# Patient Record
Sex: Male | Born: 1985 | Race: Black or African American | Hispanic: No | Smoking: Never smoker
Health system: Southern US, Community
[De-identification: ages and names within clinical notes are randomized; demographics above are authoritative.]

---

## 2006-01-30 ENCOUNTER — Emergency Department (HOSPITAL_COMMUNITY): Admission: EM | Admit: 2006-01-30 | Discharge: 2006-01-30 | Payer: Self-pay | Admitting: Emergency Medicine

## 2007-01-10 ENCOUNTER — Emergency Department (HOSPITAL_COMMUNITY): Admission: EM | Admit: 2007-01-10 | Discharge: 2007-01-10 | Payer: Self-pay | Admitting: Emergency Medicine

## 2007-03-24 ENCOUNTER — Emergency Department (HOSPITAL_COMMUNITY): Admission: EM | Admit: 2007-03-24 | Discharge: 2007-03-24 | Payer: Self-pay | Admitting: Emergency Medicine

## 2008-12-15 ENCOUNTER — Emergency Department (HOSPITAL_COMMUNITY): Admission: EM | Admit: 2008-12-15 | Discharge: 2008-12-15 | Payer: Self-pay | Admitting: Family Medicine

## 2009-02-26 ENCOUNTER — Emergency Department (HOSPITAL_COMMUNITY): Admission: EM | Admit: 2009-02-26 | Discharge: 2009-02-26 | Payer: Self-pay | Admitting: Emergency Medicine

## 2009-05-08 ENCOUNTER — Emergency Department (HOSPITAL_COMMUNITY): Admission: EM | Admit: 2009-05-08 | Discharge: 2009-05-08 | Payer: Self-pay | Admitting: Family Medicine

## 2011-03-23 LAB — GC/CHLAMYDIA PROBE AMP, GENITAL: Chlamydia, DNA Probe: NEGATIVE

## 2011-10-24 ENCOUNTER — Encounter: Payer: Self-pay | Admitting: Emergency Medicine

## 2011-10-24 ENCOUNTER — Emergency Department (HOSPITAL_COMMUNITY)
Admission: EM | Admit: 2011-10-24 | Discharge: 2011-10-24 | Disposition: A | Discharge status: Home / Self Care | Payer: BC Managed Care – PPO | Attending: Emergency Medicine | Admitting: Emergency Medicine

## 2011-10-24 DIAGNOSIS — M79609 Pain in unspecified limb: Secondary | ICD-10-CM | POA: Insufficient documentation

## 2011-10-24 DIAGNOSIS — IMO0002 Reserved for concepts with insufficient information to code with codable children: Secondary | ICD-10-CM

## 2011-10-24 DIAGNOSIS — L03019 Cellulitis of unspecified finger: Secondary | ICD-10-CM | POA: Insufficient documentation

## 2011-10-24 DIAGNOSIS — M7989 Other specified soft tissue disorders: Secondary | ICD-10-CM | POA: Insufficient documentation

## 2011-10-24 MED ORDER — DOXYCYCLINE HYCLATE 100 MG PO CAPS: 100.0000 mg | ORAL_CAPSULE | Freq: Two times a day (BID) | ORAL | Status: DC

## 2011-10-24 MED ORDER — BUPIVACAINE HCL (PF) 0.5 % IJ SOLN
INTRAMUSCULAR | Status: AC
  Administered 2011-10-24: 100 mg
  Filled 2011-10-24: qty 10

## 2011-10-24 MED ORDER — OXYCODONE-ACETAMINOPHEN 5-325 MG PO TABS: 2.0000 | ORAL_TABLET | ORAL | Status: DC | PRN

## 2011-10-24 NOTE — ED Provider Notes (Signed)
History     CSN: [id number] Arrival date & time: 10/24/2011 10:08 AM   First MD Initiated Contact with Patient 10/24/11 1106      Chief Complaint  Patient presents with  . Hand Pain    (Consider location/radiation/quality/duration/timing/severity/associated sxs/prior treatment) HPI Patient presents with pain to right distal fingertip.  No history of trauma.  Swelling and pain started yesterday morning this got worse today.  No previous medical history of sign.  No pertinent past medical history.  Patient denies fever, chills, or other signs of infection. History reviewed. No pertinent past medical history.  History reviewed. No pertinent past surgical history.  No family history on file.  History  Substance Use Topics  . Smoking status: Never Smoker   . Smokeless tobacco: Not on file  . Alcohol Use: Yes      Review of Systems  All other systems reviewed and are negative.    Allergies  Review of patient's allergies indicates no known allergies.  Home Medications  No current outpatient prescriptions on file.  BP 121/76  Pulse 71  Temp(Src) 97.5 F (36.4 C) (Oral)  Resp 18  SpO2 99%  Physical Exam  Nursing note and vitals reviewed. Constitutional: He is oriented to person, place, and time. He appears well-developed and well-nourished. No distress.  HENT:  Head: Normocephalic and atraumatic.  Eyes: Pupils are equal, round, and reactive to light.  Neck: Normal range of motion.  Cardiovascular: Normal rate and intact distal pulses.   Pulmonary/Chest: No respiratory distress.  Abdominal: Normal appearance. He exhibits no distension.  Musculoskeletal:       Hands: Neurological: He is alert and oriented to person, place, and time. No cranial nerve deficit.  Skin: Skin is warm and dry. No rash noted.  Psychiatric: He has a normal mood and affect. His behavior is normal.    ED Course  INCISION AND DRAINAGE Date/Time: 10/24/2011 12:03 PM Performed by: Nelia Shi Authorized by: Nelia Shi Consent: Verbal consent obtained. Risks and benefits: risks, benefits and alternatives were discussed Consent given by: patient Patient understanding: patient states understanding of the procedure being performed Patient consent: the patient's understanding of the procedure matches consent given Procedure consent: procedure consent matches procedure scheduled Relevant documents: relevant documents present and verified Imaging studies: imaging studies available Patient identity confirmed: verbally with patient Type: abscess Body area: upper extremity Location details: right index finger Anesthesia: digital block Local anesthetic: bupivacaine 0.5% without epinephrine Patient sedated: no Scalpel size: 11 Needle gauge: 22 Incision type: single straight Complexity: simple Drainage: bloody Drainage amount: scant Wound treatment: wound left open Patient tolerance: Patient tolerated the procedure well with no immediate complications.   (including critical care time)  Labs Reviewed - No data to display No results found.   1. Paronychia       MDM         Nelia Shi, MD 10/24/11 [phone]

## 2011-10-24 NOTE — ED Notes (Signed)
Pt. Stated, I've had a finger swollen since Friday, No injury.

## 2011-10-24 NOTE — ED Notes (Signed)
Pt reports pain to right hand onset Friday, pt noticed yesterday that right middle finger with swelling. Pt denies injury.

## 2011-10-28 ENCOUNTER — Ambulatory Visit (HOSPITAL_COMMUNITY)
Admission: EM | Admit: 2011-10-28 | Discharge: 2011-10-29 | Disposition: A | Discharge status: Home / Self Care | Payer: BC Managed Care – PPO | Attending: Orthopedic Surgery | Admitting: Orthopedic Surgery

## 2011-10-28 ENCOUNTER — Encounter (HOSPITAL_COMMUNITY): Payer: Self-pay | Admitting: Emergency Medicine

## 2011-10-28 ENCOUNTER — Encounter (HOSPITAL_COMMUNITY): Payer: Self-pay | Admitting: *Deleted

## 2011-10-28 ENCOUNTER — Observation Stay (HOSPITAL_COMMUNITY): Payer: BC Managed Care – PPO | Admitting: *Deleted

## 2011-10-28 ENCOUNTER — Emergency Department (HOSPITAL_COMMUNITY): Payer: BC Managed Care – PPO

## 2011-10-28 ENCOUNTER — Ambulatory Visit: Admit: 2011-10-28 | Payer: Self-pay | Admitting: Orthopedic Surgery

## 2011-10-28 ENCOUNTER — Encounter (HOSPITAL_COMMUNITY)
Admission: EM | Disposition: A | Discharge status: Home / Self Care | Payer: Self-pay | Source: Home / Self Care | Attending: Emergency Medicine

## 2011-10-28 DIAGNOSIS — M6789 Other specified disorders of synovium and tendon, multiple sites: Secondary | ICD-10-CM | POA: Insufficient documentation

## 2011-10-28 DIAGNOSIS — L03019 Cellulitis of unspecified finger: Secondary | ICD-10-CM | POA: Insufficient documentation

## 2011-10-28 DIAGNOSIS — IMO0002 Reserved for concepts with insufficient information to code with codable children: Secondary | ICD-10-CM | POA: Insufficient documentation

## 2011-10-28 DIAGNOSIS — M659 Synovitis and tenosynovitis, unspecified: Secondary | ICD-10-CM

## 2011-10-28 HISTORY — PX: I & D EXTREMITY: SHX5045

## 2011-10-28 LAB — POCT I-STAT, CHEM 8
BUN: 14 mg/dL (ref 6–23)
Creatinine, Ser: 1 mg/dL (ref 0.50–1.35)
Glucose, Bld: 106 mg/dL — ABNORMAL HIGH (ref 70–99)
HCT: 51 % (ref 39.0–52.0)
Hemoglobin: 17.3 g/dL — ABNORMAL HIGH (ref 13.0–17.0)
Potassium: 3.7 mEq/L (ref 3.5–5.1)
TCO2: 25 mmol/L (ref 0–100)

## 2011-10-28 LAB — CBC
Hemoglobin: 17.2 g/dL — ABNORMAL HIGH (ref 13.0–17.0)
MCV: 86.4 fL (ref 78.0–100.0)
RBC: 5.45 MIL/uL (ref 4.22–5.81)
WBC: 7.3 10*3/uL (ref 4.0–10.5)

## 2011-10-28 LAB — DIFFERENTIAL
Basophils Absolute: 0 10*3/uL (ref 0.0–0.1)
Eosinophils Absolute: 0.3 10*3/uL (ref 0.0–0.7)
Monocytes Absolute: 0.4 10*3/uL (ref 0.1–1.0)
Neutro Abs: 4.9 10*3/uL (ref 1.7–7.7)
Neutrophils Relative %: 67 % (ref 43–77)

## 2011-10-28 SURGERY — IRRIGATION AND DEBRIDEMENT EXTREMITY
Anesthesia: General | Site: Finger | Laterality: Right | Wound class: Dirty or Infected

## 2011-10-28 MED ORDER — MORPHINE SULFATE 4 MG/ML IJ SOLN
INTRAMUSCULAR | Status: AC
  Administered 2011-10-28: 2 mg via INTRAVENOUS
  Filled 2011-10-28: qty 1

## 2011-10-28 MED ORDER — MIDAZOLAM HCL 5 MG/5ML IJ SOLN
INTRAMUSCULAR | Status: DC | PRN
  Administered 2011-10-28 (×2): 0.5 mg via INTRAVENOUS
  Administered 2011-10-28: 2 mg via INTRAVENOUS

## 2011-10-28 MED ORDER — LACTATED RINGERS IV SOLN
INTRAVENOUS | Status: DC | PRN
  Administered 2011-10-28 (×2): via INTRAVENOUS

## 2011-10-28 MED ORDER — FENTANYL CITRATE 0.05 MG/ML IJ SOLN
INTRAMUSCULAR | Status: DC | PRN
  Administered 2011-10-28 (×2): 50 ug via INTRAVENOUS

## 2011-10-28 MED ORDER — SODIUM CHLORIDE 0.9 % IR SOLN
Status: DC | PRN
  Administered 2011-10-28: 3000 mL

## 2011-10-28 MED ORDER — CHLORHEXIDINE GLUCONATE 4 % EX LIQD: 60.0000 mL | Freq: Once | CUTANEOUS | Status: DC

## 2011-10-28 MED ORDER — ACETAMINOPHEN 325 MG PO TABS
650.0000 mg | ORAL_TABLET | Freq: Once | ORAL | Status: AC
  Administered 2011-10-28: 650 mg via ORAL
  Filled 2011-10-28: qty 2

## 2011-10-28 MED ORDER — MORPHINE SULFATE 4 MG/ML IJ SOLN
4.0000 mg | Freq: Once | INTRAMUSCULAR | Status: AC
  Administered 2011-10-28: 2 mg via INTRAVENOUS
  Administered 2011-10-28: 4 mg via INTRAVENOUS
  Filled 2011-10-28: qty 1

## 2011-10-28 MED ORDER — OXYCODONE-ACETAMINOPHEN 5-325 MG PO TABS
2.0000 | ORAL_TABLET | ORAL | Status: DC | PRN
  Administered 2011-10-29 (×2): 2 via ORAL
  Filled 2011-10-28 (×2): qty 2

## 2011-10-28 MED ORDER — SENNOSIDES-DOCUSATE SODIUM 8.6-50 MG PO TABS
1.0000 | ORAL_TABLET | Freq: Every evening | ORAL | Status: DC | PRN
  Filled 2011-10-28: qty 1

## 2011-10-28 MED ORDER — MORPHINE SULFATE 2 MG/ML IJ SOLN
1.0000 mg | INTRAMUSCULAR | Status: DC | PRN
  Administered 2011-10-28: 1 mg via INTRAVENOUS
  Filled 2011-10-28: qty 1

## 2011-10-28 MED ORDER — FENTANYL CITRATE 0.05 MG/ML IJ SOLN
25.0000 ug | INTRAMUSCULAR | Status: DC | PRN
  Administered 2011-10-28 (×2): 50 ug via INTRAVENOUS

## 2011-10-28 MED ORDER — METOCLOPRAMIDE HCL 10 MG PO TABS: 5.0000 mg | ORAL_TABLET | Freq: Three times a day (TID) | ORAL | Status: DC | PRN

## 2011-10-28 MED ORDER — KETAMINE HCL 10 MG/ML IJ SOLN
INTRAMUSCULAR | Status: DC | PRN
  Administered 2011-10-28: 25 mg via INTRAVENOUS

## 2011-10-28 MED ORDER — LIDOCAINE HCL (CARDIAC) 20 MG/ML IV SOLN
INTRAVENOUS | Status: DC | PRN
  Administered 2011-10-28: 60 mg via INTRAVENOUS

## 2011-10-28 MED ORDER — METHOCARBAMOL 500 MG PO TABS
500.0000 mg | ORAL_TABLET | Freq: Four times a day (QID) | ORAL | Status: DC | PRN
  Administered 2011-10-29: 500 mg via ORAL
  Filled 2011-10-28: qty 1

## 2011-10-28 MED ORDER — METOCLOPRAMIDE HCL 5 MG/ML IJ SOLN: 5.0000 mg | Freq: Three times a day (TID) | INTRAMUSCULAR | Status: DC | PRN

## 2011-10-28 MED ORDER — METHOCARBAMOL 100 MG/ML IJ SOLN
500.0000 mg | Freq: Four times a day (QID) | INTRAVENOUS | Status: DC | PRN
  Administered 2011-10-28: 500 mg via INTRAVENOUS
  Filled 2011-10-28: qty 5

## 2011-10-28 MED ORDER — SULFAMETHOXAZOLE-TRIMETHOPRIM 800-160 MG PO TABS: 1.0000 | ORAL_TABLET | Freq: Two times a day (BID) | ORAL | Status: AC

## 2011-10-28 MED ORDER — ONDANSETRON HCL 4 MG/2ML IJ SOLN
INTRAMUSCULAR | Status: DC | PRN
  Administered 2011-10-28: 4 mg via INTRAVENOUS

## 2011-10-28 MED ORDER — OXYCODONE-ACETAMINOPHEN 5-325 MG PO TABS: 1.0000 | ORAL_TABLET | Freq: Four times a day (QID) | ORAL | Status: AC | PRN

## 2011-10-28 MED ORDER — ACETAMINOPHEN 10 MG/ML IV SOLN
INTRAVENOUS | Status: DC | PRN
  Administered 2011-10-28: 1000 mg via INTRAVENOUS

## 2011-10-28 MED ORDER — TEMAZEPAM 15 MG PO CAPS: 15.0000 mg | ORAL_CAPSULE | Freq: Every evening | ORAL | Status: DC | PRN

## 2011-10-28 MED ORDER — DIPHENHYDRAMINE HCL 12.5 MG/5ML PO ELIX
12.5000 mg | ORAL_SOLUTION | ORAL | Status: DC | PRN
  Filled 2011-10-28: qty 10

## 2011-10-28 MED ORDER — ONDANSETRON HCL 4 MG/2ML IJ SOLN
4.0000 mg | Freq: Once | INTRAMUSCULAR | Status: AC
  Administered 2011-10-28: 4 mg via INTRAVENOUS
  Filled 2011-10-28: qty 2

## 2011-10-28 MED ORDER — PROPOFOL 10 MG/ML IV EMUL
INTRAVENOUS | Status: DC | PRN
  Administered 2011-10-28: 200 mg via INTRAVENOUS

## 2011-10-28 MED ORDER — MORPHINE SULFATE 2 MG/ML IJ SOLN
1.0000 mg | INTRAMUSCULAR | Status: DC | PRN
  Administered 2011-10-29 (×3): 1 mg via INTRAVENOUS
  Filled 2011-10-28 (×3): qty 1

## 2011-10-28 MED ORDER — CEFAZOLIN SODIUM 1-5 GM-% IV SOLN
1.0000 g | Freq: Once | INTRAVENOUS | Status: AC
  Administered 2011-10-28 (×2): 1 g via INTRAVENOUS
  Filled 2011-10-28: qty 50

## 2011-10-28 MED ORDER — CEFAZOLIN SODIUM 1-5 GM-% IV SOLN
1.0000 g | Freq: Three times a day (TID) | INTRAVENOUS | Status: DC
  Administered 2011-10-29 (×2): 1 g via INTRAVENOUS
  Filled 2011-10-28 (×3): qty 50

## 2011-10-28 SURGICAL SUPPLY — 62 items
BANDAGE COBAN STERILE 2 (GAUZE/BANDAGES/DRESSINGS) IMPLANT
BANDAGE CONFORM 2  STR LF (GAUZE/BANDAGES/DRESSINGS) IMPLANT
BANDAGE ELASTIC 3 VELCRO ST LF (GAUZE/BANDAGES/DRESSINGS) ×3 IMPLANT
BANDAGE ELASTIC 4 VELCRO ST LF (GAUZE/BANDAGES/DRESSINGS) ×2 IMPLANT
BANDAGE GAUZE ELAST BULKY 4 IN (GAUZE/BANDAGES/DRESSINGS) ×2 IMPLANT
BNDG CMPR 9X4 STRL LF SNTH (GAUZE/BANDAGES/DRESSINGS)
BNDG COHESIVE 1X5 TAN STRL LF (GAUZE/BANDAGES/DRESSINGS) IMPLANT
BNDG ESMARK 4X9 LF (GAUZE/BANDAGES/DRESSINGS) IMPLANT
CLOTH BEACON ORANGE TIMEOUT ST (SAFETY) ×2 IMPLANT
CORDS BIPOLAR (ELECTRODE) ×2 IMPLANT
COVER SURGICAL LIGHT HANDLE (MISCELLANEOUS) ×2 IMPLANT
CUFF TOURN SGL QUICK 18 (TOURNIQUET CUFF) ×1 IMPLANT
DECANTER SPIKE VIAL GLASS SM (MISCELLANEOUS) ×2 IMPLANT
DRAIN PENROSE 1/4X12 LTX STRL (WOUND CARE) IMPLANT
DRSG ADAPTIC 3X8 NADH LF (GAUZE/BANDAGES/DRESSINGS) IMPLANT
DRSG EMULSION OIL 3X3 NADH (GAUZE/BANDAGES/DRESSINGS) ×1 IMPLANT
DRSG PAD ABDOMINAL 8X10 ST (GAUZE/BANDAGES/DRESSINGS) ×3 IMPLANT
GAUZE KERLIX 2  STERILE LF (GAUZE/BANDAGES/DRESSINGS) ×1 IMPLANT
GAUZE PACKING IODOFORM 1/4X5 (PACKING) ×1 IMPLANT
GAUZE XEROFORM 1X8 LF (GAUZE/BANDAGES/DRESSINGS) ×1 IMPLANT
GLOVE BIO SURGEON STRL SZ7.5 (GLOVE) ×2 IMPLANT
GLOVE BIOGEL PI IND STRL 8 (GLOVE) ×1 IMPLANT
GLOVE BIOGEL PI INDICATOR 8 (GLOVE) ×1
GLOVE SURG SS PI 8.5 STRL IVOR (GLOVE) ×2
GLOVE SURG SS PI 8.5 STRL STRW (GLOVE) IMPLANT
GOWN PREVENTION PLUS LG XLONG (DISPOSABLE) ×1 IMPLANT
GOWN STRL REIN XL XLG (GOWN DISPOSABLE) ×2 IMPLANT
HANDPIECE INTERPULSE COAX TIP (DISPOSABLE)
IV CYSTO TUBING LATEX FREE (IV SOLUTION) ×2 IMPLANT
KIT BASIN OR (CUSTOM PROCEDURE TRAY) ×2 IMPLANT
KIT ROOM TURNOVER OR (KITS) ×2 IMPLANT
LOOP VESSEL MAXI BLUE (MISCELLANEOUS) IMPLANT
LOOP VESSEL MINI RED (MISCELLANEOUS) IMPLANT
MANIFOLD NEPTUNE II (INSTRUMENTS) ×2 IMPLANT
NDL HYPO 25X1 1.5 SAFETY (NEEDLE) IMPLANT
NEEDLE HYPO 25X1 1.5 SAFETY (NEEDLE) ×2 IMPLANT
NS IRRIG 1000ML POUR BTL (IV SOLUTION) ×2 IMPLANT
PACK ORTHO EXTREMITY (CUSTOM PROCEDURE TRAY) ×2 IMPLANT
PAD ARMBOARD 7.5X6 YLW CONV (MISCELLANEOUS) ×2 IMPLANT
PADDING CAST ABS 3INX4YD NS (CAST SUPPLIES) ×1
PADDING CAST ABS COTTON 3X4 (CAST SUPPLIES) IMPLANT
PADDING WEBRIL 3 STERILE (GAUZE/BANDAGES/DRESSINGS) ×1 IMPLANT
SCRUB BETADINE 4OZ XXX (MISCELLANEOUS) ×1 IMPLANT
SET HNDPC FAN SPRY TIP SCT (DISPOSABLE) IMPLANT
SOLUTION BETADINE 4OZ (MISCELLANEOUS) ×1 IMPLANT
SPONGE GAUZE 4X4 12PLY (GAUZE/BANDAGES/DRESSINGS) ×2 IMPLANT
SPONGE LAP 18X18 X RAY DECT (DISPOSABLE) ×1 IMPLANT
SPONGE LAP 4X18 X RAY DECT (DISPOSABLE) ×1 IMPLANT
SUCTION FRAZIER TIP 10 FR DISP (SUCTIONS) ×1 IMPLANT
SUT ETHILON 4 0 PS 2 18 (SUTURE) ×1 IMPLANT
SUT MON AB 5-0 P3 18 (SUTURE) IMPLANT
SWAB CULTURE LIQ STUART DBL (MISCELLANEOUS) ×2 IMPLANT
SYR 30ML LL (SYRINGE) ×1 IMPLANT
SYR CONTROL 10ML LL (SYRINGE) ×1 IMPLANT
TOWEL OR 17X24 6PK STRL BLUE (TOWEL DISPOSABLE) ×1 IMPLANT
TOWEL OR 17X26 10 PK STRL BLUE (TOWEL DISPOSABLE) ×3 IMPLANT
TUBE ANAEROBIC SPECIMEN COL (MISCELLANEOUS) IMPLANT
TUBE CONNECTING 12X1/4 (SUCTIONS) ×1 IMPLANT
TUBE FEEDING 5FR 15 INCH (TUBING) ×1 IMPLANT
UNDERPAD 30X30 INCONTINENT (UNDERPADS AND DIAPERS) ×2 IMPLANT
WATER STERILE IRR 1000ML POUR (IV SOLUTION) ×1 IMPLANT
YANKAUER SUCT BULB TIP NO VENT (SUCTIONS) ×1 IMPLANT

## 2011-10-28 NOTE — Brief Op Note (Signed)
10/28/2011  10:09 PM  PATIENT:  Fred Romero  25 y.o. male  PRE-OPERATIVE DIAGNOSIS:  infection right long finger  POST-OPERATIVE DIAGNOSIS:  infection right long finger  PROCEDURE:  Procedure(s): IRRIGATION AND DEBRIDEMENT EXTREMITY  SURGEON:  Surgeon(s): Tami Ribas  PHYSICIAN ASSISTANT:   ASSISTANTS: none   ANESTHESIA:   general  EBL:  Total I/O In: 1000 [I.V.:1000] Out: -   BLOOD ADMINISTERED:none  DRAINS: none   LOCAL MEDICATIONS USED:  NONE  SPECIMEN:  Source of Specimen:  right long finger  DISPOSITION OF SPECIMEN:  micro  COUNTS:  YES  TOURNIQUET:  * Missing tourniquet times found for documented tourniquets in log:  9719 *  DICTATION: .Other Dictation: Dictation Number [id number]  PLAN OF CARE: Admit for overnight observation  PATIENT DISPOSITION:  PACU - hemodynamically stable.

## 2011-10-28 NOTE — Transfer of Care (Signed)
Immediate Anesthesia Transfer of Care Note  Patient: Fred Romero  Procedure(s) Performed:  IRRIGATION AND DEBRIDEMENT EXTREMITY  Patient Location: PACU  Anesthesia Type: General  Level of Consciousness: awake, sedated, patient cooperative and responds to stimulation  Airway & Oxygen Therapy: Patient Spontanous Breathing and Patient connected to face mask oxygen  Post-op Assessment: Report given to PACU RN  Post vital signs: Reviewed and stable  Complications: No apparent anesthesia complications

## 2011-10-28 NOTE — ED Provider Notes (Signed)
History     CSN: [id number] Arrival date & time: 10/28/2011  9:26 AM   First MD Initiated Contact with Patient 10/28/11 [phone]      Chief Complaint  Patient presents with  . Finger Injury    (Consider location/radiation/quality/duration/timing/severity/associated sxs/prior treatment) HPI Comments: Patient presents with chief complaint of right middle finger swelling and pain. Patient was seen in emergency department on November 11 and treated for a paronychia, placed on antibiotics, and discharged home with pain medicine. The patient reports that the swelling and pain continued despite treatments.  Patient denies fever, nausea, vomiting.   Patient is a 25 y.o. male presenting with hand pain.  Hand Pain This is a new problem. The current episode started in the past 7 days. The problem occurs constantly. The problem has been rapidly worsening. Associated symptoms include joint swelling and a rash. Pertinent negatives include no abdominal pain, chest pain, chills, fever, headaches, myalgias, nausea, numbness, sore throat, vomiting or weakness. Exacerbated by: extending or palpating finger. He has tried oral narcotics and rest (antibiotics) for the symptoms. The treatment provided no relief.    History reviewed. No pertinent past medical history.  History reviewed. No pertinent past surgical history.  No family history on file.  History  Substance Use Topics  . Smoking status: Never Smoker   . Smokeless tobacco: Not on file  . Alcohol Use: Yes      Review of Systems  Constitutional: Negative for fever and chills.  HENT: Negative for sore throat and rhinorrhea.   Eyes: Negative for redness.  Respiratory: Negative for shortness of breath.   Cardiovascular: Negative for chest pain.  Gastrointestinal: Negative for nausea, vomiting, abdominal pain, diarrhea and constipation.  Genitourinary: Negative for dysuria.  Musculoskeletal: Positive for joint swelling. Negative for myalgias.    Skin: Positive for rash.  Neurological: Negative for dizziness, weakness, numbness and headaches.    Allergies  Review of patient's allergies indicates no known allergies.  Home Medications   Current Outpatient Rx  Name Route Sig Dispense Refill  . DOXYCYCLINE HYCLATE 100 MG PO CAPS Oral Take 1 capsule (100 mg total) by mouth 2 (two) times daily. 20 capsule 0  . OXYCODONE-ACETAMINOPHEN 5-325 MG PO TABS Oral Take 2 tablets by mouth every 4 (four) hours as needed for pain. 15 tablet 0    BP 118/83  Pulse 88  Temp(Src) 97.5 F (36.4 C) (Oral)  Resp 16  SpO2 100%  Physical Exam  Nursing note and vitals reviewed. Constitutional: He is oriented to person, place, and time. He appears well-developed and well-nourished.  HENT:  Head: Normocephalic and atraumatic.  Eyes: Conjunctivae are normal. Right eye exhibits no discharge. Left eye exhibits no discharge.  Neck: Normal range of motion. Neck supple.  Cardiovascular: Normal rate, regular rhythm and normal heart sounds.   Pulmonary/Chest: Effort normal and breath sounds normal.  Abdominal: Soft. Bowel sounds are normal. There is no tenderness. There is no rebound and no guarding.  Musculoskeletal: He exhibits edema and tenderness.       Patient with swelling and tenderness of entire right long finger. The patient holds the finger slightly flexed, is unable to actively extend the finger, and has significant pain when finger is passively extended. The tenderness extends onto the volar aspect of the hand over the flexor tendon.  Neurological: He is alert and oriented to person, place, and time.  Skin: Skin is warm and dry.  Psychiatric: He has a normal mood and affect.    ED  Course  Procedures (including critical care time)  Labs Reviewed  CBC - Abnormal; Notable for the following:    Hemoglobin 17.2 (*)    MCHC 36.5 (*)    All other components within normal limits  DIFFERENTIAL  I-STAT, CHEM 8   No results found.   1.  Flexor tenosynovitis of finger    12:03 PM Patient seen and examined. The patient was seen by and discussed with Dr. Alto Denver. Dr. Merlyn Lot of orthopedic hand surgery was consulted given suspicion for flexor tenosynovitis. He has seen the patient in the CDU and has elected to take the patient to the operating room. The patient has received Ancef, pain medicine in the CDU and remains comfortable. The patient was placed n.p.o. and awaits admission.   MDM  Admission for flexor tenosynovitis.        Eustace Moore [place], [place] 10/28/11 1205

## 2011-10-28 NOTE — H&P (Signed)
Fred Romero is an 25 y.o. male.   Chief Complaint: right long finger infection HPI: 25 yo male with pain and swelling in right long finger.  No known wound or injury.  Does bite fingernails.  Noted swelling began on Sunday.  Seen in ED Sunday where he states index finger was numbed and opened.  Started on Doxycycline.  Continued pain and swelling in long finger.  No fevers, chills, night sweats.  No previous problems with finger, no other issues at this time.  History reviewed. No pertinent past medical history.  History reviewed. No pertinent past surgical history.  No family history on file. Social History:  reports that he has never smoked. He does not have any smokeless tobacco history on file. He reports that he drinks alcohol. He reports that he uses illicit drugs (Marijuana).  Allergies: No Known Allergies  Medications Prior to Admission  Medication Dose Route Frequency Provider Last Rate Last Dose  . ceFAZolin (ANCEF) IVPB 1 g/50 mL premix  1 g Intravenous Once Carolee Rota, PA   1 g at 10/28/11 1119  . morphine 2 MG/ML injection 1 mg  1 mg Intravenous Q1H PRN Nicki Reaper, MD      . morphine 4 MG/ML injection 4 mg  4 mg Intravenous Once Carolee Rota, PA   2 mg at 10/28/11 1231  . ondansetron (ZOFRAN) injection 4 mg  4 mg Intravenous Once Carolee Rota, PA   4 mg at 10/28/11 1116   Medications Prior to Admission  Medication Sig Dispense Refill  . doxycycline (VIBRAMYCIN) 100 MG capsule Take 1 capsule (100 mg total) by mouth 2 (two) times daily.  20 capsule  0  . oxyCODONE-acetaminophen (PERCOCET) 5-325 MG per tablet Take 2 tablets by mouth every 4 (four) hours as needed for pain.  15 tablet  0    Results for orders placed during the hospital encounter of 10/28/11 (from the past 48 hour(s))  CBC     Status: Abnormal   Collection Time   10/28/11 11:19 AM      Component Value Range Comment   WBC 7.3  4.0 - 10.5 (K/uL)    RBC 5.45  4.22 - 5.81 (MIL/uL)    Hemoglobin 17.2 (*) 13.0 - 17.0 (g/dL)    HCT 16.1  09.6 - 04.5 (%)    MCV 86.4  78.0 - 100.0 (fL)    MCH 31.6  26.0 - 34.0 (pg)    MCHC 36.5 (*) 30.0 - 36.0 (g/dL)    RDW 40.9  81.1 - 91.4 (%)    Platelets 227  150 - 400 (K/uL)   DIFFERENTIAL     Status: Normal   Collection Time   10/28/11 11:19 AM      Component Value Range Comment   Neutrophils Relative 67  43 - 77 (%)    Neutro Abs 4.9  1.7 - 7.7 (K/uL)    Lymphocytes Relative 23  12 - 46 (%)    Lymphs Abs 1.7  0.7 - 4.0 (K/uL)    Monocytes Relative 6  3 - 12 (%)    Monocytes Absolute 0.4  0.1 - 1.0 (K/uL)    Eosinophils Relative 4  0 - 5 (%)    Eosinophils Absolute 0.3  0.0 - 0.7 (K/uL)    Basophils Relative 0  0 - 1 (%)    Basophils Absolute 0.0  0.0 - 0.1 (K/uL)   POCT I-STAT, CHEM 8     Status: Abnormal  Collection Time   10/28/11 12:07 PM      Component Value Range Comment   Sodium 138  135 - 145 (mEq/L)    Potassium 3.7  3.5 - 5.1 (mEq/L)    Chloride 103  96 - 112 (mEq/L)    BUN 14  6 - 23 (mg/dL)    Creatinine, Ser 5.40  0.50 - 1.35 (mg/dL)    Glucose, Bld 981 (*) 70 - 99 (mg/dL)    Calcium, Ion 1.91  1.12 - 1.32 (mmol/L)    TCO2 25  0 - 100 (mmol/L)    Hemoglobin 17.3 (*) 13.0 - 17.0 (g/dL)    HCT 47.8  29.5 - 62.1 (%)     No results found.   A comprehensive review of systems was negative.  Blood pressure 142/71, pulse 71, temperature 97.5 F (36.4 C), temperature source Oral, resp. rate 16, SpO2 98.00%.  General appearance: alert, cooperative and appears stated age Head: Normocephalic, without obvious abnormality, atraumatic Neck: supple, symmetrical, trachea midline Resp: clear to auscultation bilaterally Cardio: regular rate and rhythm GI: soft, non-tender; bowel sounds normal; no masses,  no organomegaly Extremities: Left upper extremity with intact sensation and capillary refill in all digits.  +epl/fpl/io.  No wounds.  Right upper extremity intact sensation and capillary refill all digits except  long.  Decreased sensation in long with normal capillary refill.  +epl/fpl/io.  swelling in long finger distal phalanx in pad and dorsally around nail.  pain with palpation of swollen areas.  also with some tenderness over middle and proximal phalanx.  pain with full extension.  no swelling in middle/proximal phalanges.  no proximal streaks. Pulses: 2+ and symmetric Skin: as in extremity exam Neurologic: Grossly normal Incision/Wound: No wound on long finger.  Small wound dorsoradial index by nail.  Assessment/Plan Right long finger felon/paronychia/possible early flexor tenosynovitis.  Recommend irrigation and debridement in OR.  Risks, benefits, and alternatives discussed with patient and he agrees with plan of care.  OR scheduled for this afternoon.  Lido Maske R 10/28/2011, 1:23 PM

## 2011-10-28 NOTE — ED Notes (Signed)
Presents with swollen rt middle finger. States sx began on Sunday and was seen here in dept and rx'd abx. Pt returns stating no improvement. Affected finger unable to extend w/o severe pain. Pt now stating pain radiating into hand. States unable grasp objects w/o pain. Denies fevers. Awaiting hand surgery consult. Pt in no acute distress, a&ox3.

## 2011-10-28 NOTE — ED Notes (Signed)
Onset 4 days ago right middle finger swelling unknown cause was given antibiotics and work note however pain continued.

## 2011-10-28 NOTE — Op Note (Signed)
Dictation [id number]

## 2011-10-28 NOTE — ED Provider Notes (Signed)
Medical screening examination/treatment/procedure(s) were conducted as a shared visit with non-physician practitioner(s) and myself.  I personally evaluated the patient during the encounter  MSK: right hand with 3rd digit with fusiform swelling, TTP over the flexor surface of the affected digit, and pain on passive extension  Cyndra Numbers, MD 10/28/11 2113

## 2011-10-28 NOTE — Anesthesia Preprocedure Evaluation (Signed)
Anesthesia Evaluation  Patient identified by MRN, date of birth, ID band Patient awake    Reviewed: Allergy & Precautions, H&P , NPO status , Patient's Chart, lab work & pertinent test results  Airway Mallampati: II TM Distance: >3 FB Neck ROM: Full    Dental No notable dental hx.    Pulmonary neg pulmonary ROS,  clear to auscultation  Pulmonary exam normal       Cardiovascular neg cardio ROS Regular Normal    Neuro/Psych Negative Neurological ROS  Negative Psych ROS   GI/Hepatic negative GI ROS, Neg liver ROS,   Endo/Other  Negative Endocrine ROS  Renal/GU negative Renal ROS  Genitourinary negative   Musculoskeletal negative musculoskeletal ROS (+)   Abdominal   Peds negative pediatric ROS (+)  Hematology negative hematology ROS (+)   Anesthesia Other Findings   Reproductive/Obstetrics negative OB ROS                           Anesthesia Physical Anesthesia Plan  ASA: II  Anesthesia Plan: General   Post-op Pain Management:    Induction: Intravenous  Airway Management Planned: LMA  Additional Equipment:   Intra-op Plan:   Post-operative Plan:   Informed Consent: I have reviewed the patients History and Physical, chart, labs and discussed the procedure including the risks, benefits and alternatives for the proposed anesthesia with the patient or authorized representative who has indicated his/her understanding and acceptance.   Dental advisory given  Plan Discussed with: CRNA  Anesthesia Plan Comments:         Anesthesia Quick Evaluation

## 2011-10-28 NOTE — ED Notes (Signed)
Pt received room assignment of 5003.  Report called to Nicholaus Bloom, RN.  All questions answered. Pt to floor via wheelchair.  NAD noted; no further c/o's voiced.

## 2011-10-28 NOTE — Anesthesia Postprocedure Evaluation (Signed)
  Anesthesia Post-op Note  Patient: Fred Romero  Procedure(s) Performed:  IRRIGATION AND DEBRIDEMENT EXTREMITY - IRRIGATION AND DEBRIDMENT RIGHT LONG FINGER  Patient Location: PACU  Anesthesia Type: General  Level of Consciousness: awake and alert   Airway and Oxygen Therapy: Patient Spontanous Breathing  Post-op Pain: mild  Post-op Assessment: Post-op Vital signs reviewed, Patient's Cardiovascular Status Stable, Respiratory Function Stable, Patent Airway and No signs of Nausea or vomiting  Post-op Vital Signs: stable  Complications: No apparent anesthesia complications

## 2011-10-28 NOTE — Preoperative (Signed)
Beta Blockers   Reason not to administer Beta Blockers:Not Applicable, pt not on a bb at home

## 2011-10-28 NOTE — ED Notes (Signed)
Pt transferred to yellow 32 from CDU7 for holding.  Pt to have surgery performed on 3rd digit of right hand.  Site upon transfer with significant amount of swelling.  Pt rates pain at 7/10 and constant.  He describes pain as burning, stabbing. States "feels like a tooth ache in his finger".  Pain worsens with movement.  VSS.  Monitoring continues.

## 2011-10-29 ENCOUNTER — Encounter (HOSPITAL_COMMUNITY): Payer: Self-pay | Admitting: Orthopedic Surgery

## 2011-10-29 NOTE — Op Note (Signed)
Fred Romero, Fred Romero NO.:  [id number]  MEDICAL RECORD NO.:  [id number]  LOCATION:  1614                         FACILITY:  [facility]  PHYSICIAN:  Betha Loa, MD        DATE OF BIRTH:  [birth year]  DATE OF PROCEDURE:  10/28/2011 DATE OF DISCHARGE:                              OPERATIVE REPORT   PREOPERATIVE DIAGNOSIS:  Right long finger paronychia, felon, and flexor sheath infection.  POSTOPERATIVE DIAGNOSIS:  Right long finger paronychia, felon, and flexor sheath infection.  PROCEDURE:  Irrigation and debridement of right long finger paronychia, felon, and flexor sheath.  SURGEON:  Betha Loa, MD.  ASSISTANT:  None.  ANESTHESIA:  General.  IV FLUIDS:  Per anesthesia flow sheet.  ESTIMATED BLOOD LOSS:  Minimal.  COMPLICATIONS:  None.  SPECIMENS:  Cultures to micro.  TOURNIQUET TIME:  35 minutes.  DISPOSITION:  Stable to PACU.  INDICATIONS:  Fred Romero is a 25 year old male who on Sunday began to notice pain in his right long finger.  He presented to  [facility] emergency department where an irrigation and debridement was performed. He states this was done on the index finger, however.  He was placed on doxycycline.  He continued to have swelling and pain in the right long finger.  He returned to the emergency department today and I was consulted for management of injury.  On examination, he had a swollen and tender distal phalanx of the right long finger.  There is swelling dorsally at the base of the nail as well as in the pad of the finger. He is also tender over the proximal and middle phalanges volarly.  He had pain with extension of the finger.  I discussed Mr. Lovan the nature of infections of the finger.  I recommended going to the operating room for irrigation and debridement of the paronychia, felon, and flexor tendon sheath.  Risks, benefits, and alternatives of surgery were discussed including risk of blood loss, infection, damage to  nerves, vessels, tendons, ligaments, bone, fair surgery, need for additional surgery, complications with wound healing, continued pain, continued infection, need for repeat irrigation and debridement.  He agreed with the plan of care.  He will be given IV Ancef in the emergency department.  OPERATIVE COURSE:  After being identified preoperatively by myself, the Patient & I agreed upon procedure & site of procedure.  The surgical site was marked.  The risks, benefits, and alternative of surgery were reviewed and he wished to proceed.  Surgical consent was signed.  He was transported to the operating room, placed on the operating table in supine position with the right upper extremity on arm board.  General anesthesia was induced by the anesthesiologist.  Right upper extremity was prepped and draped in normal sterile orthopedic fashion.  Surgical pause was performed between surgeons, anesthesia, and operating room staff, and all were in agreement as to the patient, procedure, and site of procedure.  Tourniquet at the proximal aspect of the extremity was inflated to 250 mmHg after exsanguination of the forearm with an Esmarch bandage.  Gravity exsanguination was used to the hand.  An incision was made volarly over the pad of the long  finger.  There was gross purulence. This was cultured for both aerobes and anaerobes.  Incision was made at the dorsal aspect of the finger paronychia.  There was gross purulence in this area as well.  Some of the skin had been devitalized and this was debrided.  The infected area was debrided.  It was ensured that the entire pad of the finger had been released.  The wounds were copiously irrigated with sterile saline.  A incision was made in the palm of the hand over the A1 pulley.  This carried into subcutaneous tissues.  Care was taken to protect the neurovascular bundles.  A #5 pediatric feeding tube was placed at the flexor tendon sheath.  There is no  gross purulence within the sheath.  100 mL of sterile saline was irrigated through the flexor tendon sheath by #5 pediatric feeding tube. There is good effluent from both the proximal and distal wounds.  All wounds were then again copiously irrigated with 1000 mL of sterile saline.  The pediatric feeding tube was removed.  All wounds were packed with quarter-inch iodoform gauze.  They were then dressed with sterile 4x4s, Kerlix, and a volar splint placed, wrapped with Kerlix and Ace bandage.  Tourniquet was deflated at 35 minutes.  Fingertips were pink with brisk capillary refill after deflation of tourniquet.  Operative drapes were broken down.  The patient was awoken from anesthesia safely. He was transferred back to stretcher, taken to PACU in stable condition. We will keep him overnight for IV antibiotics and discharge him home in the morning.     Betha Loa, MD     KK/MEDQ  D:  10/28/2011  T:  10/29/2011  Job:  [id number]

## 2011-11-02 LAB — ANAEROBIC CULTURE

## 2012-05-09 ENCOUNTER — Encounter (HOSPITAL_COMMUNITY): Payer: Self-pay | Admitting: Emergency Medicine

## 2012-05-09 ENCOUNTER — Emergency Department (HOSPITAL_COMMUNITY): Payer: BC Managed Care – PPO

## 2012-05-09 ENCOUNTER — Emergency Department (HOSPITAL_COMMUNITY)
Admission: EM | Admit: 2012-05-09 | Discharge: 2012-05-10 | Disposition: A | Discharge status: Home / Self Care | Payer: BC Managed Care – PPO | Attending: Emergency Medicine | Admitting: Emergency Medicine

## 2012-05-09 DIAGNOSIS — R071 Chest pain on breathing: Secondary | ICD-10-CM | POA: Insufficient documentation

## 2012-05-09 DIAGNOSIS — R0789 Other chest pain: Secondary | ICD-10-CM

## 2012-05-09 DIAGNOSIS — I440 Atrioventricular block, first degree: Secondary | ICD-10-CM | POA: Insufficient documentation

## 2012-05-09 LAB — DIFFERENTIAL
Basophils Relative: 1 % (ref 0–1)
Eosinophils Absolute: 0.4 10*3/uL (ref 0.0–0.7)
Eosinophils Relative: 4 % (ref 0–5)
Monocytes Relative: 8 % (ref 3–12)
Neutrophils Relative %: 60 % (ref 43–77)

## 2012-05-09 LAB — CBC
Hemoglobin: 16.7 g/dL (ref 13.0–17.0)
MCH: 31.5 pg (ref 26.0–34.0)
MCHC: 35.5 g/dL (ref 30.0–36.0)
Platelets: 209 10*3/uL (ref 150–400)

## 2012-05-09 MED ORDER — KETOROLAC TROMETHAMINE 30 MG/ML IJ SOLN
30.0000 mg | Freq: Once | INTRAMUSCULAR | Status: AC
  Administered 2012-05-09: 30 mg via INTRAMUSCULAR
  Filled 2012-05-09: qty 1

## 2012-05-09 NOTE — ED Notes (Signed)
Chest pain after playing basketball three days ago.  Denies any shortness of breath.  Paitent states he has not had this before.

## 2012-05-09 NOTE — ED Provider Notes (Signed)
History     CSN: [id number]  Arrival date & time 05/09/12  2053   None     Chief Complaint  Patient presents with  . Chest Pain    (Consider location/radiation/quality/duration/timing/severity/associated sxs/prior treatment) HPI Comments: After playing basketball 3 days ago.  Patient had some mild chest discomfort.  That was are relatively nonspecific.  Today, he developed a sharp, stabbing pain in the substernal area, with nausea, that he's never had before.  This is been constant since then.  He does not endorse history of asthma.  Cardiac history hypertension,  Smoking.  He does use marijuana on a regular basis.  He states on Friday and Saturday had several episodes of vomiting without diarrhea  Patient is a 26 y.o. male presenting with chest pain. The history is provided by the patient.  Chest Pain The chest pain began 3 - 5 days ago. Chest pain occurs constantly. The chest pain is worsening. The pain is associated with breathing, coughing, exertion and lifting. At its most intense, the pain is at 6/10. Pertinent negatives for primary symptoms include no fever, no shortness of breath, no cough, no palpitations, no nausea and no vomiting.     History reviewed. No pertinent past medical history.  Past Surgical History  Procedure Date  . I&d extremity 10/28/2011    Procedure: IRRIGATION AND DEBRIDEMENT EXTREMITY;  Surgeon: Tami Ribas;  Location: [facility];  Service: Orthopedics;  Laterality: Right;  IRRIGATION AND DEBRIDMENT RIGHT LONG FINGER    History reviewed. No pertinent family history.  History  Substance Use Topics  . Smoking status: Never Smoker   . Smokeless tobacco: Never Used  . Alcohol Use: Yes     occassionaly      Review of Systems  Constitutional: Negative for fever.  Respiratory: Negative for cough, chest tightness and shortness of breath.   Cardiovascular: Positive for chest pain. Negative for palpitations.  Gastrointestinal: Negative for nausea and  vomiting.    Allergies  Review of patient's allergies indicates no known allergies.  Home Medications   Current Outpatient Rx  Name Route Sig Dispense Refill  . CYCLOBENZAPRINE HCL 10 MG PO TABS Oral Take 0.5 tablets (5 mg total) by mouth 3 (three) times daily as needed. 20 tablet 0  . IBUPROFEN 600 MG PO TABS Oral Take 1 tablet (600 mg total) by mouth every 6 (six) hours as needed for pain. 30 tablet 0    BP 137/72  Pulse 74  Temp(Src) 99.2 F (37.3 C) (Oral)  Resp 20  SpO2 98%  Physical Exam  Constitutional: He appears well-developed. He appears distressed.  HENT:  Head: Normocephalic.  Eyes: Pupils are equal, round, and reactive to light.  Neck: Normal range of motion.  Cardiovascular: Normal rate and regular rhythm.   Pulmonary/Chest: Effort normal and breath sounds normal. He exhibits no tenderness.  Abdominal: Soft. He exhibits no distension. There is no tenderness.  Musculoskeletal: Normal range of motion.  Neurological: He is alert.  Skin: Skin is warm and dry. No rash noted.    ED Course  Procedures (including critical care time)   Labs Reviewed  CBC  DIFFERENTIAL  COMPREHENSIVE METABOLIC PANEL  LIPASE, BLOOD   Dg Chest 2 View  05/10/2012  *RADIOLOGY REPORT*  Clinical Data: Mid chest pain.  CHEST - 2 VIEW  Comparison: 01/10/2007.  Findings:  The heart size and mediastinal contours are within normal limits.  Both lungs are clear.  The visualized skeletal structures are unremarkable. Little change from priors.  IMPRESSION: No active cardiopulmonary disease.  Original Report Authenticated By: Elsie Stain, M.D.     1. Chest wall pain     ED ECG REPORT   Date: 05/10/2012  EKG Time: 1:18 AM  Rate: 82  Rhythm: normal sinus rhythm with first degree AV block there are no previous tracings available for comparison  Axis: normal  Intervals:first-degree A-V block   ST&T Change: normal  Narrative Interpretation: abnormal   Patient feels better after being  medicated with IM Toradol I reviewed.  His chest x-ray.  His lab work, which were all normal         MDM   Although I cannot reproduce this discomfort.  I feel that this is muscular in nature.  We'll obtain chest x-ray, EKG, CBC i-STAT        Arman Filter, NP 05/10/12 [phone]

## 2012-05-10 LAB — COMPREHENSIVE METABOLIC PANEL
Albumin: 3.8 g/dL (ref 3.5–5.2)
Alkaline Phosphatase: 56 U/L (ref 39–117)
BUN: 12 mg/dL (ref 6–23)
Calcium: 9.5 mg/dL (ref 8.4–10.5)
Potassium: 3.5 mEq/L (ref 3.5–5.1)
Total Protein: 6.6 g/dL (ref 6.0–8.3)

## 2012-05-10 LAB — LIPASE, BLOOD: Lipase: 15 U/L (ref 11–59)

## 2012-05-10 MED ORDER — IBUPROFEN 600 MG PO TABS: 600.0000 mg | ORAL_TABLET | Freq: Four times a day (QID) | ORAL | Status: AC | PRN

## 2012-05-10 MED ORDER — CYCLOBENZAPRINE HCL 10 MG PO TABS: 5.0000 mg | ORAL_TABLET | Freq: Three times a day (TID) | ORAL | Status: AC | PRN

## 2012-05-10 NOTE — Discharge Instructions (Signed)
Chest Wall Pain Chest wall pain is pain in or around the bones and muscles of your chest. It may take up to 6 weeks to get better. It may take longer if you must stay physically active in your work and activities.  CAUSES  Chest wall pain may happen on its own. However, it may be caused by:  A viral illness like the flu.   Injury.   Coughing.   Exercise.   Arthritis.   Fibromyalgia.   Shingles.  HOME CARE INSTRUCTIONS   Avoid overtiring physical activity. Try not to strain or perform activities that cause pain. This includes any activities using your chest or your abdominal and side muscles, especially if heavy weights are used.   Put ice on the sore area.   Put ice in a plastic bag.   Place a towel between your skin and the bag.   Leave the ice on for 15 to 20 minutes per hour while awake for the first 2 days.   Only take over-the-counter or prescription medicines for pain, discomfort, or fever as directed by your caregiver.  SEEK IMMEDIATE MEDICAL CARE IF:   Your pain increases, or you are very uncomfortable.   You have a fever.   Your chest pain becomes worse.   You have new, unexplained symptoms.   You have nausea or vomiting.   You feel sweaty or lightheaded.   You have a cough with phlegm (sputum), or you cough up blood.  MAKE SURE YOU:   Understand these instructions.   Will watch your condition.   Will get help right away if you are not doing well or get worse.  Document Released: 11/29/2005 Document Revised: 11/18/2011 Document Reviewed: 07/26/2011 Exit[employer] Patient Information 2012 [employer]. 

## 2012-05-10 NOTE — ED Provider Notes (Signed)
Medical screening examination/treatment/procedure(s) were performed by non-physician practitioner and as supervising physician I was immediately available for consultation/collaboration.    Aarti Mankowski D Caidon Foti, MD 05/10/12 0702 

## 2012-08-25 ENCOUNTER — Emergency Department (HOSPITAL_COMMUNITY)
Admission: EM | Admit: 2012-08-25 | Discharge: 2012-08-25 | Disposition: A | Discharge status: Home / Self Care | Payer: BC Managed Care – PPO | Attending: Emergency Medicine | Admitting: Emergency Medicine

## 2012-08-25 ENCOUNTER — Emergency Department (HOSPITAL_COMMUNITY)
Admission: EM | Admit: 2012-08-25 | Discharge: 2012-08-25 | Discharge status: Absent without leave | Payer: BC Managed Care – PPO | Source: Home / Self Care | Attending: Emergency Medicine | Admitting: Emergency Medicine

## 2012-08-25 ENCOUNTER — Encounter (HOSPITAL_COMMUNITY): Payer: Self-pay

## 2012-08-25 DIAGNOSIS — J040 Acute laryngitis: Secondary | ICD-10-CM | POA: Insufficient documentation

## 2012-08-25 NOTE — ED Provider Notes (Signed)
History   This chart was scribed for Fred Racer, MD by Charolett Bumpers . The patient was seen in room TR06C/TR06C. Patient's care was started at 1336.   CSN: [id number]  Arrival date & time 08/25/12  1218   First MD Initiated Contact with Patient 08/25/12 1336      Chief Complaint  Patient presents with  . Sore Throat  . Hoarse    (Consider location/radiation/quality/duration/timing/severity/associated sxs/prior treatment) HPI Fred Romero is a 26 y.o. male who presents to the Emergency Department complaining of persistent, mild hoarseness for the past 2 days. Pt denies any associated pain or sore throat. Pt reports some associated rhinorhea. He denies any fevers, chills, cough, decreased energy, difficulty breathing. Pt denies any recent changes in activity. Pt reports that he otherwise feels healthy. Pt denies any recent sick contacts or allergies.   No past medical history on file.  Past Surgical History  Procedure Date  . I&d extremity 10/28/2011    Procedure: IRRIGATION AND DEBRIDEMENT EXTREMITY;  Surgeon: Tami Ribas;  Location: [facility];  Service: Orthopedics;  Laterality: Right;  IRRIGATION AND DEBRIDMENT RIGHT LONG FINGER    No family history on file.  History  Substance Use Topics  . Smoking status: Never Smoker   . Smokeless tobacco: Never Used  . Alcohol Use: Yes     occassionaly      Review of Systems A complete 10 system review of systems was obtained and all systems are negative except as noted in the HPI and PMH.   Allergies  Review of patient's allergies indicates no known allergies.  Home Medications  No current outpatient prescriptions on file.  BP 123/97  Pulse 77  Temp 98.5 F (36.9 C) (Oral)  Resp 18  SpO2 98%  Physical Exam  Nursing note and vitals reviewed. Constitutional: He is oriented to person, place, and time. He appears well-developed and well-nourished. No distress.  HENT:  Head: Normocephalic and atraumatic.    Mouth/Throat: No oropharyngeal exudate.       Mild posterior oropharyngeal erythema. No exudate noted.   Eyes: EOM are normal.  Neck: Normal range of motion. Neck supple. No tracheal deviation present.  Cardiovascular: Normal rate, regular rhythm and normal heart sounds.   Pulmonary/Chest: Effort normal and breath sounds normal. No stridor. No respiratory distress. He has no wheezes.  Musculoskeletal: Normal range of motion.  Neurological: He is alert and oriented to person, place, and time.  Skin: Skin is warm and dry.  Psychiatric: He has a normal mood and affect. His behavior is normal.    ED Course  Procedures (including critical care time)  DIAGNOSTIC STUDIES: Oxygen Saturation is 98% on room air, normal by my interpretation.    COORDINATION OF CARE:  13:39-Discussed planned course of treatment with the patient including resting his voice.. Discussed strict return precautions. Will d/c pt, pt is aggreeable with plan.     Labs Reviewed - No data to display No results found.   1. Laryngitis       MDM  I personally performed the services described in this documentation, which was scribed in my presence. The recorded information has been reviewed and considered.        Fred Racer, MD 08/25/12 [phone]

## 2012-08-25 NOTE — ED Notes (Signed)
Pt complains of sore throat and hoarseness for 2 days, denies exposure to illness or allergies.

## 2013-03-31 IMAGING — CR DG HAND COMPLETE 3+V*R*
3 series · 3 of 3 positions shown · non-contrast
Comparison: None.

CLINICAL DATA: Infected middle finger.  Middle ears swelling and
pain.

RIGHT HAND - COMPLETE 3+ VIEW

[x hand pa right]
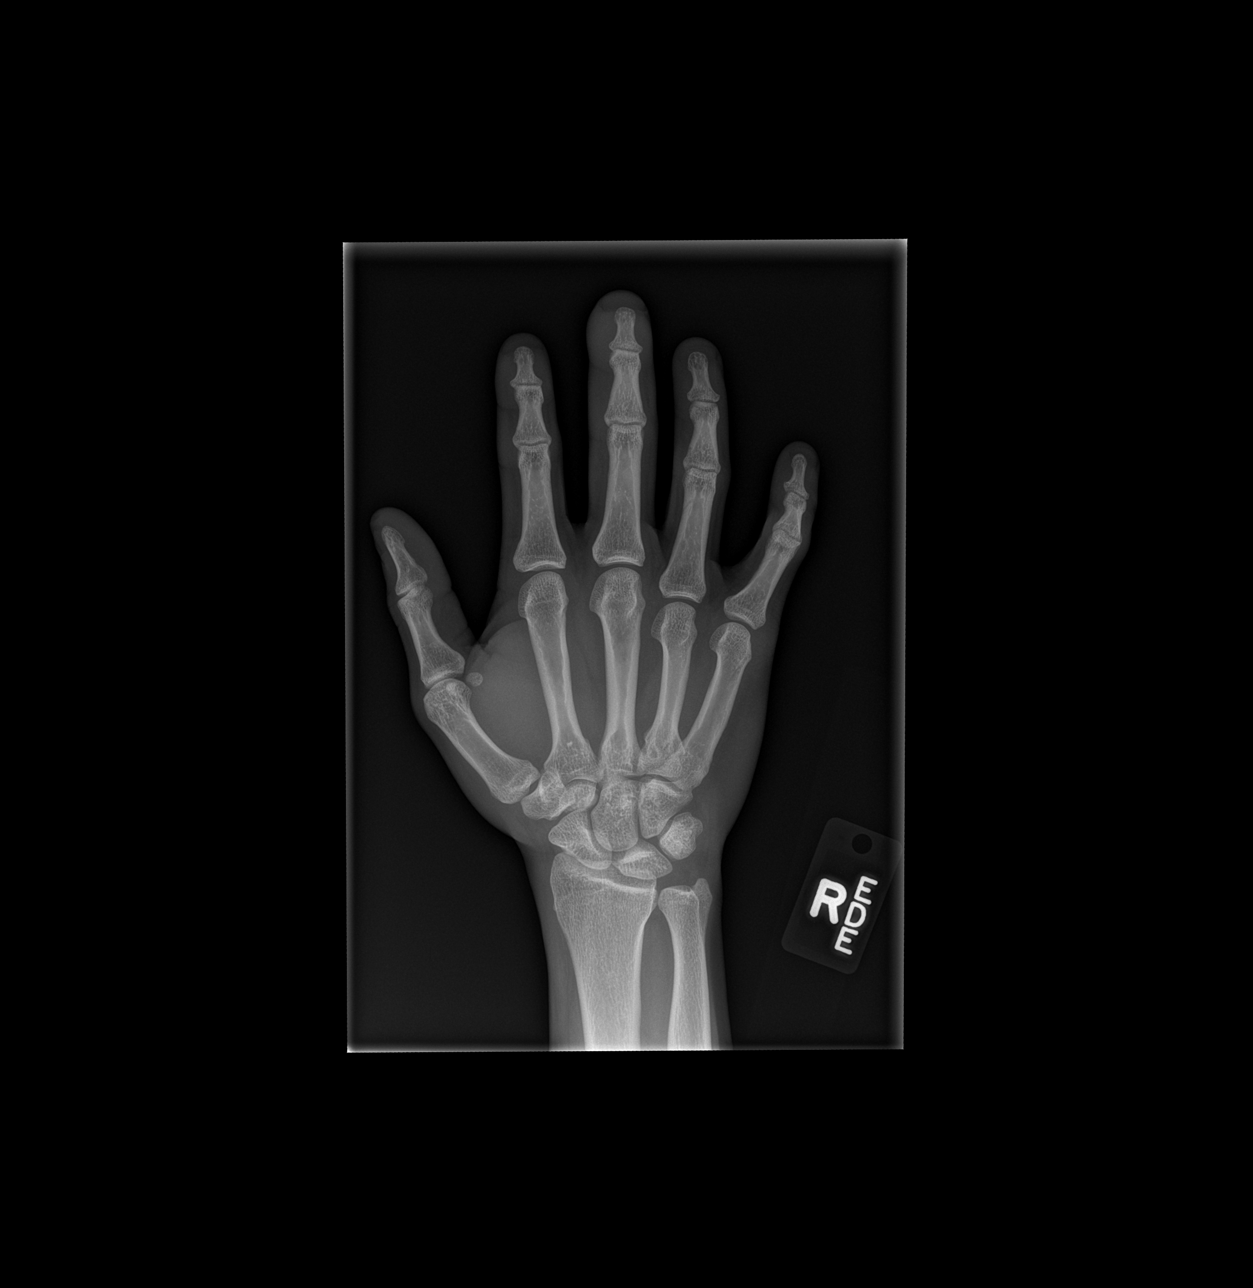

[x hand obl right]
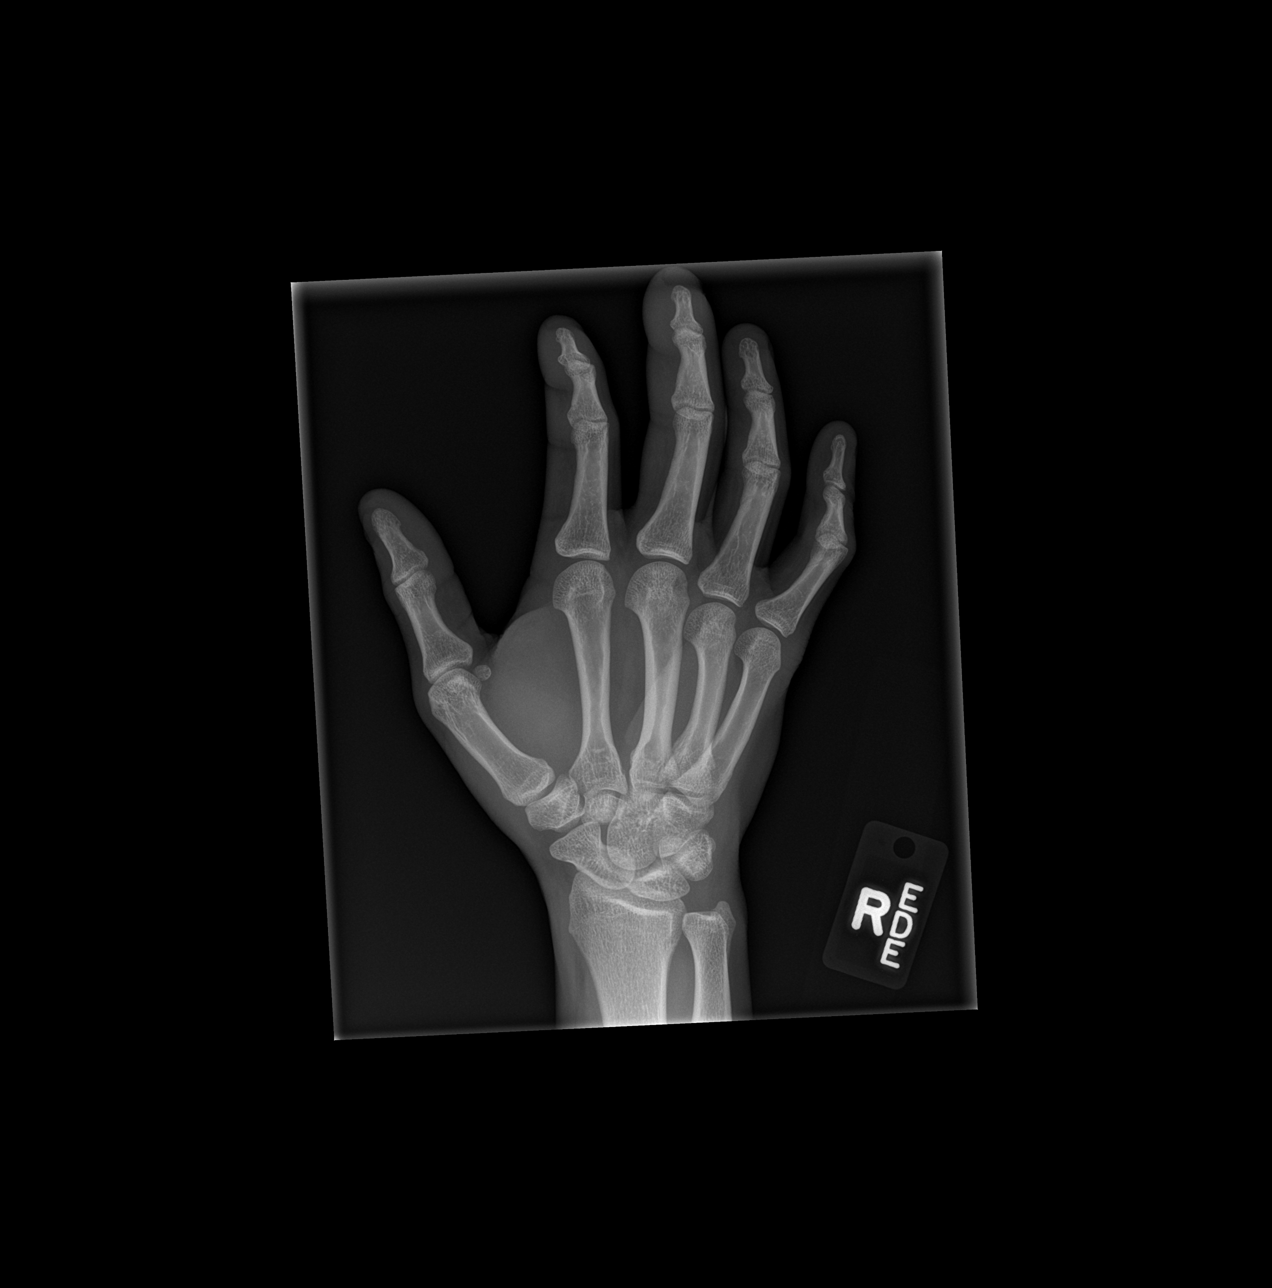

[x hand lat right]
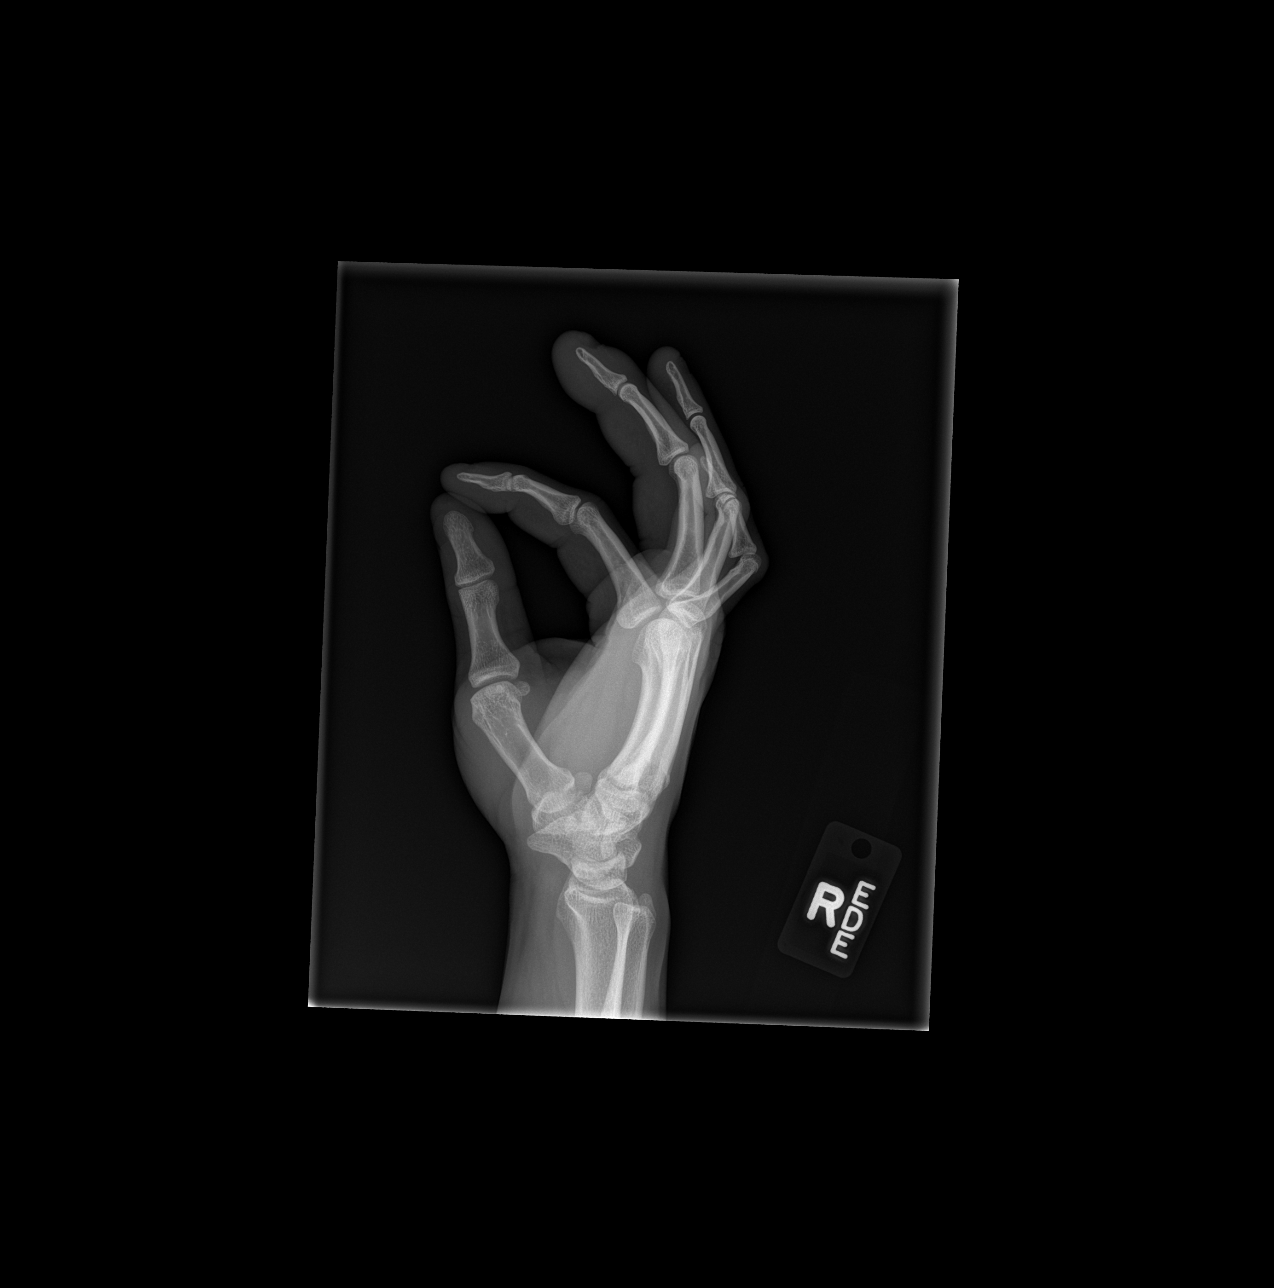

[3 of 3 positions shown; findings below may reference images not displayed]

FINDINGS: There is no evidence of fracture or dislocation.  There
is no evidence of arthropathy or other focal bone abnormality. Soft
tissue swelling is seen involving the distal middle finger, however
there is no evidence of soft tissue gas or radiopaque foreign body.
IMPRESSION: Soft tissue swelling of the distal middle finger.  No evidence of
soft tissue gas, radiopaque foreign body, or underlying bone
abnormality.

## 2013-09-25 ENCOUNTER — Encounter (HOSPITAL_COMMUNITY): Payer: Self-pay | Admitting: Emergency Medicine

## 2013-09-25 ENCOUNTER — Emergency Department (INDEPENDENT_AMBULATORY_CARE_PROVIDER_SITE_OTHER)
Admission: EM | Admit: 2013-09-25 | Discharge: 2013-09-25 | Disposition: A | Discharge status: Home / Self Care | Payer: BC Managed Care – PPO | Source: Home / Self Care | Attending: Family Medicine | Admitting: Family Medicine

## 2013-09-25 DIAGNOSIS — M545 Low back pain: Secondary | ICD-10-CM

## 2013-09-25 MED ORDER — CYCLOBENZAPRINE HCL 10 MG PO TABS: 10.0000 mg | ORAL_TABLET | Freq: Every evening | ORAL | Status: DC | PRN

## 2013-09-25 MED ORDER — PREDNISONE 10 MG PO KIT: PACK | ORAL | Status: DC

## 2013-09-25 MED ORDER — TRAMADOL HCL 50 MG PO TABS: 50.0000 mg | ORAL_TABLET | Freq: Four times a day (QID) | ORAL | Status: DC | PRN

## 2013-09-25 NOTE — ED Provider Notes (Signed)
Fred Romero is a 27 y.o. male who presents to Urgent Care today for left-sided low back pain present for about 2 weeks without injury. No radiating pain weakness numbness bowel bladder dysfunction or difficulty walking. Pain is moderate and worse with activity. He has not tried any medications yet. He feels well otherwise. 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    History reviewed. No pertinent past medical history. History  Substance Use Topics  . Smoking status: Never Smoker   . Smokeless tobacco: Never Used  . Alcohol Use: Yes     Comment: occassionaly   ROS as above Medications reviewed. No current facility-administered medications for this encounter.   Current Outpatient Prescriptions  Medication Sig Dispense Refill  . cyclobenzaprine (FLEXERIL) 10 MG tablet Take 1 tablet (10 mg total) by mouth at bedtime as needed for muscle spasms.  20 tablet  0  . PredniSONE 10 MG KIT 12 day dose pack po  1 kit  0  . traMADol (ULTRAM) 50 MG tablet Take 1 tablet (50 mg total) by mouth every 6 (six) hours as needed for pain.  15 tablet  0    Exam:  BP 129/77  Pulse 65  Temp(Src) 98.4 F (36.9 C) (Oral)  Resp 17  SpO2 100% Gen: Well NAD BACK: Nontender to spinal midline. Tender to palpation left lumbar paraspinals and SI joint. Negative straight leg raise test bilaterally Negative Faber test bilaterally Back range of motion is intact Strength is intact to lower extremities. Patient is able to stand up and get on and off exam table by himself, squat and stand on toes and heels and has a normal gait.  His sensation and reflexes are equal bilateral lower extremities  No results found for this or any previous visit (from the past 24 hour(s)). No results found.  Assessment and Plan: 27 y.o. male with lumbago.  Plan to treat with prednisone Dosepak, Flexeril, tramadol Exercises and heating pad as needed as well Followup with Dr. Katrinka Blazing of [facility] if not improved Discussed  warning signs or symptoms. Please see discharge instructions. Patient expresses understanding.      Rodolph Bong, MD 09/25/13 [phone]

## 2013-09-25 NOTE — ED Notes (Signed)
C/o lower back pain for the past two weeks. Constant soreness in lower back. States works at [employer] doing a lot of heavy lifting.   No otc med tried for symptoms. States "feels like I pulled something"

## 2013-10-10 ENCOUNTER — Encounter (HOSPITAL_COMMUNITY): Payer: Self-pay | Admitting: Emergency Medicine

## 2013-10-10 ENCOUNTER — Emergency Department (HOSPITAL_COMMUNITY)
Admission: EM | Admit: 2013-10-10 | Discharge: 2013-10-10 | Disposition: A | Discharge status: Home / Self Care | Payer: BC Managed Care – PPO | Attending: Emergency Medicine | Admitting: Emergency Medicine

## 2013-10-10 DIAGNOSIS — G8929 Other chronic pain: Secondary | ICD-10-CM | POA: Insufficient documentation

## 2013-10-10 DIAGNOSIS — M545 Low back pain, unspecified: Secondary | ICD-10-CM | POA: Insufficient documentation

## 2013-10-10 DIAGNOSIS — R209 Unspecified disturbances of skin sensation: Secondary | ICD-10-CM | POA: Insufficient documentation

## 2013-10-10 DIAGNOSIS — R509 Fever, unspecified: Secondary | ICD-10-CM | POA: Insufficient documentation

## 2013-10-10 DIAGNOSIS — M549 Dorsalgia, unspecified: Secondary | ICD-10-CM

## 2013-10-10 MED ORDER — HYDROCODONE-ACETAMINOPHEN 5-325 MG PO TABS: 1.0000 | ORAL_TABLET | ORAL | Status: DC | PRN

## 2013-10-10 MED ORDER — MELOXICAM 15 MG PO TABS: 15.0000 mg | ORAL_TABLET | Freq: Every day | ORAL | Status: DC

## 2013-10-10 NOTE — ED Notes (Signed)
Pt works for [employer] and states he has frequent back pain. States he usually "just goes to the chiropractor" and gets better. Two days ago, woke up to go to work and "was hurting and tingling all over". Has been out of work for 2 days.

## 2013-10-10 NOTE — ED Notes (Signed)
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

## 2013-10-10 NOTE — ED Provider Notes (Signed)
CSN: [id number]     Arrival date & time 10/10/13  0935 History  This chart was scribed for non-physician practitioner Arthor Captain, PA-C working with Shelda Jakes, MD by Valera Castle, ED scribe. This patient was seen in room TR09C/TR09C and the patient's care was started at 10:45 AM.     Chief Complaint  Patient presents with  . Back Pain    The history is provided by the patient. No language interpreter was used.   HPI Comments: Fred Romero is a 27 y.o. male with a h/o chronic back pain who presents to the Emergency Department complaining of sudden, sharp, tingling, spasm-like, constant, lower back pain, with back stiffness, onset 2 weeks ago. He reports that movement exacerbates the pain, but that he is able to ambulate. He reports numbness and tingling in his legs last night after taking his medications. He reports reciving these medications 09/29/2013. He reports taking pain medication, with no relief. 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 He reports that he has been out of work the last couple of days due to the pain. He reports an associated subjective fever. He denies IV drug use, rashes, bowel and bladder incontinence, dysuria, hematuria, and any other associated symptoms. He reports he would like to go back to work as soon as possible.   History reviewed. No pertinent past medical history. Past Surgical History  Procedure Laterality Date  . I&d extremity  10/28/2011    Procedure: IRRIGATION AND DEBRIDEMENT EXTREMITY;  Surgeon: Tami Ribas;  Location: [facility];  Service: Orthopedics;  Laterality: Right;  IRRIGATION AND DEBRIDMENT RIGHT LONG FINGER   History reviewed. No pertinent family history. History  Substance Use Topics  . Smoking status: Never Smoker   . Smokeless tobacco: Never Used  . Alcohol Use: Yes     Comment: occassionaly    Review of Systems   Constitutional: Positive for fever (subjective).  Genitourinary: Negative for dysuria and hematuria.       Negative for bladder and bowel incontinence.  Musculoskeletal: Positive for back pain (lower).  Skin: Negative for rash.  Neurological: Positive for numbness (and tingling in lower extremities).  All other systems reviewed and are negative.   Allergies  Review of patient's allergies indicates no known allergies.  Home Medications   Current Outpatient Rx  Name  Route  Sig  Dispense  Refill  . cyclobenzaprine (FLEXERIL) 10 MG tablet   Oral   Take 1 tablet (10 mg total) by mouth at bedtime as needed for muscle spasms.   20 tablet   0   . traMADol (ULTRAM) 50 MG tablet   Oral   Take 1 tablet (50 mg total) by mouth every 6 (six) hours as needed for pain.   15 tablet   0    Triage Vitals: BP 134/75  Pulse 79  Temp(Src) 97.6 F (36.4 C) (Oral)  Resp 18  SpO2 100%  Physical Exam  Nursing note and vitals reviewed. Constitutional: He is oriented to person, place, and time. He appears well-developed and well-nourished. No distress.  HENT:  Head: Normocephalic and atraumatic.  Eyes: Conjunctivae and EOM are normal. No scleral icterus.  Neck: Normal range of motion. Neck supple. No tracheal deviation present.  Cardiovascular: Normal rate, regular rhythm and normal heart sounds.   Pulmonary/Chest: Effort normal and breath sounds normal. No respiratory distress.  Abdominal: Soft.  There is no tenderness.  Musculoskeletal: Normal range of motion. He exhibits no edema.  ttp BL lumbar paraspinals. Notable spasm. No midline tenderness. ROM limited due to pain and spasm  Neurological: He is alert and oriented to person, place, and time.  Skin: Skin is warm and dry. He is not diaphoretic.  Psychiatric: He has a normal mood and affect. His behavior is normal.    ED Course  Procedures (including critical care time)  DIAGNOSTIC STUDIES: Oxygen Saturation is 100% on room air,  normal by my interpretation.    COORDINATION OF CARE: 10:52 AM-Discussed treatment plan with pt at bedside and pt agreed to plan.   Labs Review Labs Reviewed - No data to display Imaging Review No results found.  EKG Interpretation   None      No orders of the defined types were placed in this encounter.     MDM   1. Back pain    Patient with back pain.  No neurological deficits and normal neuro exam.  Patient can walk but states is painful.  No loss of bowel or bladder control.  No concern for cauda equina.  No fever, night sweats, weight loss, h/o cancer, IVDU.  RICE protocol and pain medicine indicated and discussed with patient.     I personally performed the services described in this documentation, which was scribed in my presence. The recorded information has been reviewed and is accurate.    Arthor Captain, PA-C 10/10/13 1117  Arthor Captain, PA-C 10/10/13 1118

## 2013-10-11 IMAGING — CR DG CHEST 2V
2 series · 2 of 2 positions shown · non-contrast
Comparison: 01/10/2007.

CLINICAL DATA: Mid chest pain.

CHEST - 2 VIEW

[w chest pa]
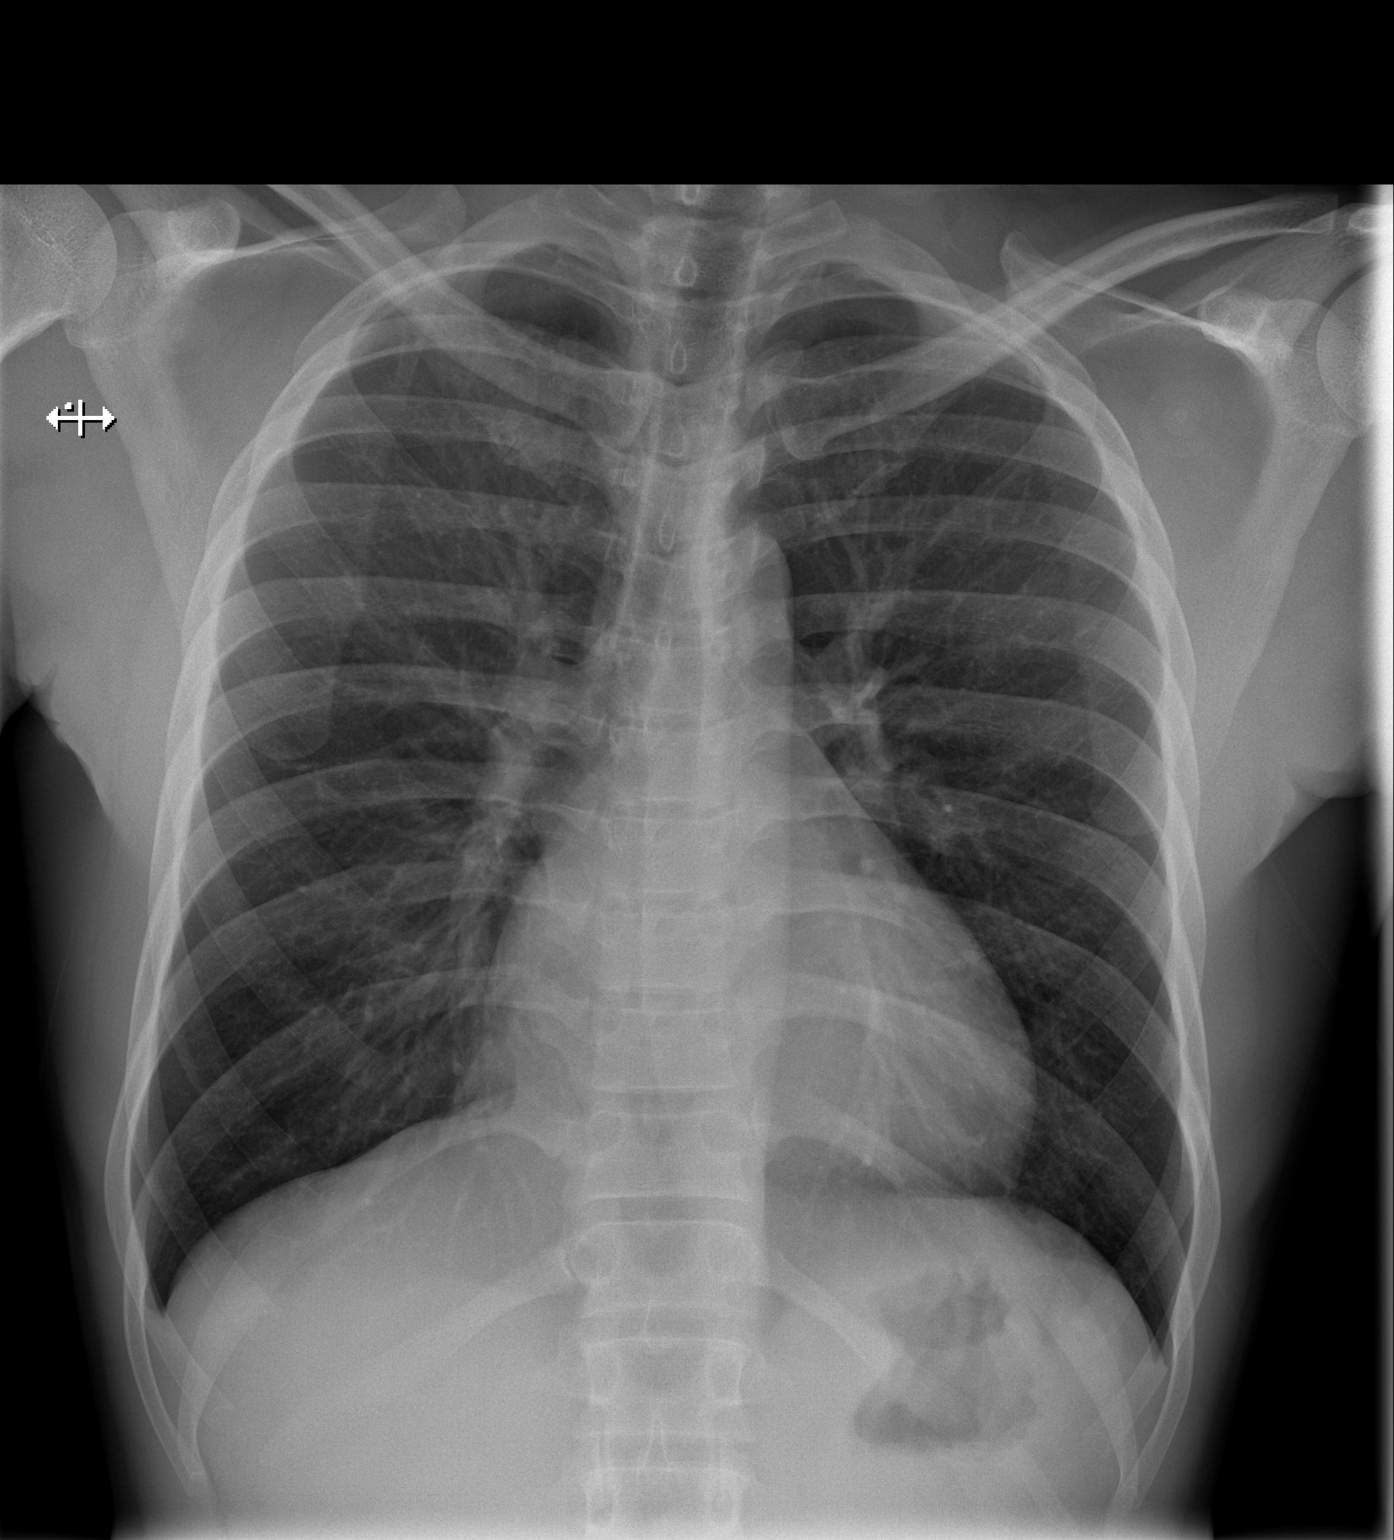

[w chest lat]
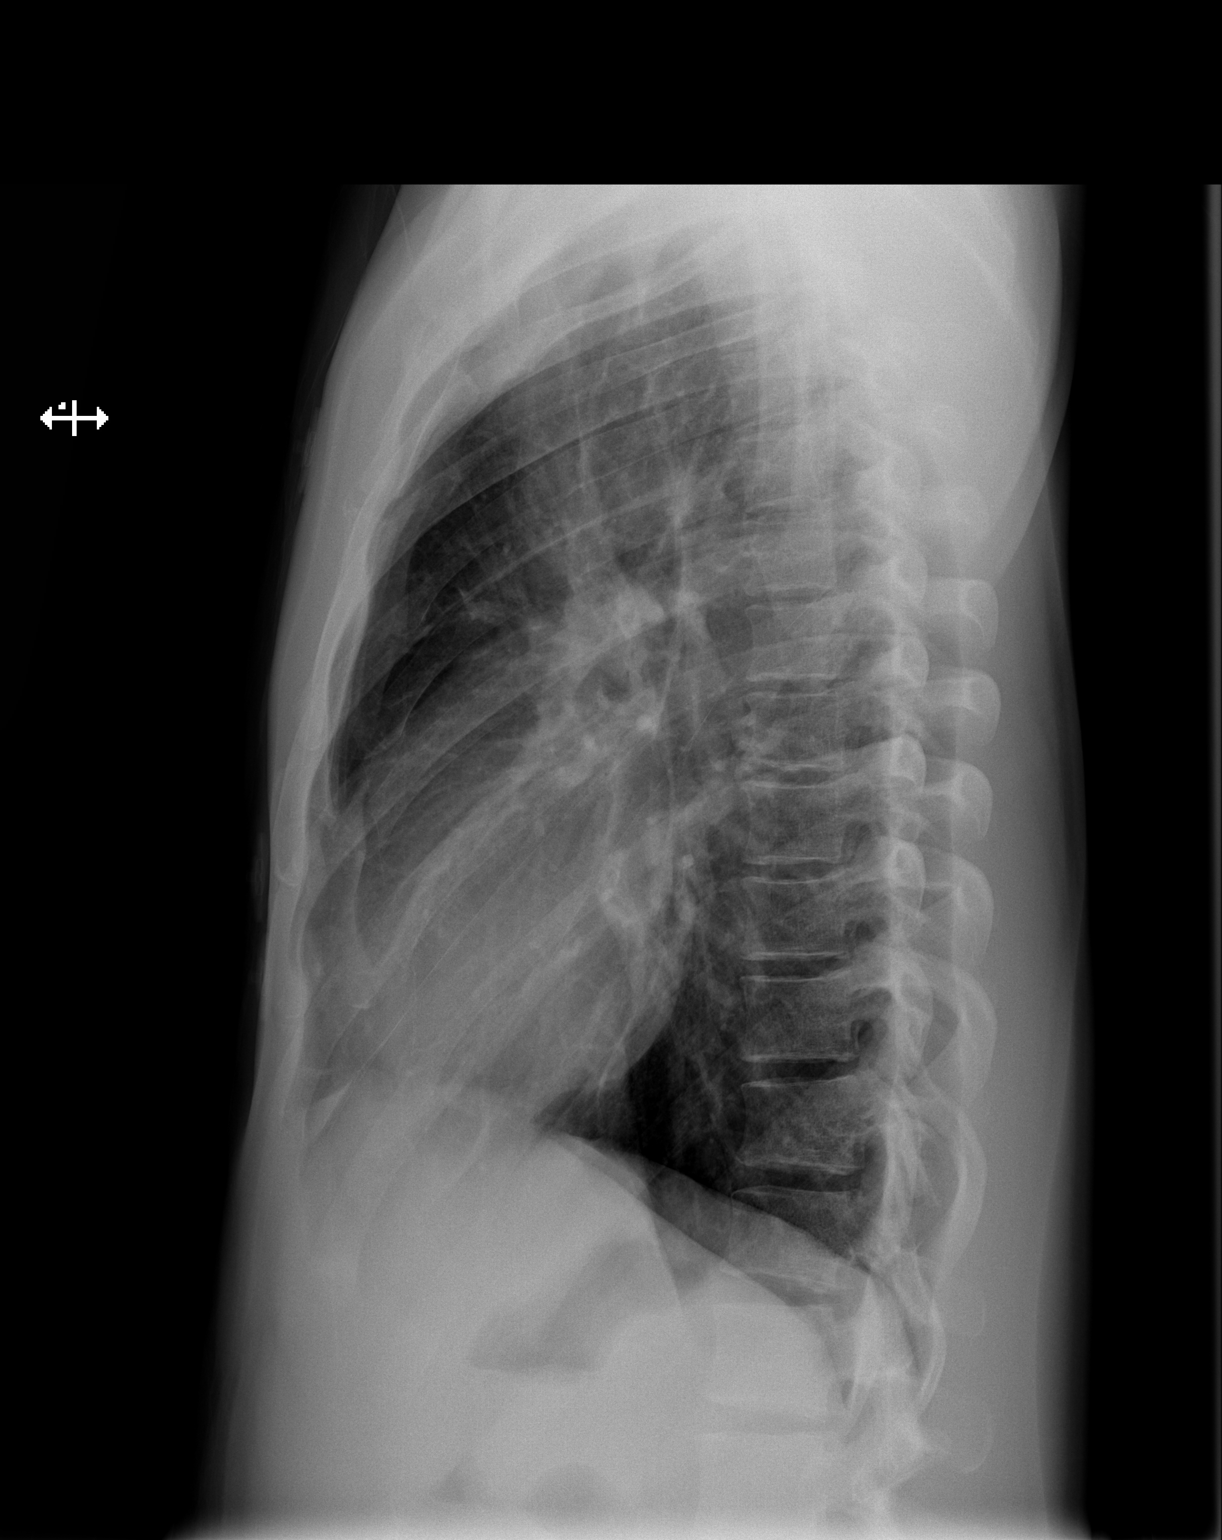

[2 of 2 positions shown; findings below may reference images not displayed]

FINDINGS: The heart size and mediastinal contours are within
normal limits.  Both lungs are clear.  The visualized skeletal
structures are unremarkable. Little change from priors.
IMPRESSION: No active cardiopulmonary disease.

## 2013-10-12 NOTE — ED Provider Notes (Signed)
Medical screening examination/treatment/procedure(s) were performed by non-physician practitioner and as supervising physician I was immediately available for consultation/collaboration.  EKG Interpretation   None         Shelda Jakes, MD 10/12/13 [phone]

## 2013-11-30 ENCOUNTER — Emergency Department (HOSPITAL_COMMUNITY)
Admission: EM | Admit: 2013-11-30 | Discharge: 2013-11-30 | Disposition: A | Discharge status: Home / Self Care | Payer: BC Managed Care – PPO | Attending: Emergency Medicine | Admitting: Emergency Medicine

## 2013-11-30 ENCOUNTER — Encounter (HOSPITAL_COMMUNITY): Payer: Self-pay | Admitting: Emergency Medicine

## 2013-11-30 DIAGNOSIS — L03019 Cellulitis of unspecified finger: Secondary | ICD-10-CM | POA: Insufficient documentation

## 2013-11-30 DIAGNOSIS — Z79899 Other long term (current) drug therapy: Secondary | ICD-10-CM | POA: Insufficient documentation

## 2013-11-30 MED ORDER — TRAMADOL HCL 50 MG PO TABS: 50.0000 mg | ORAL_TABLET | Freq: Four times a day (QID) | ORAL | Status: DC | PRN

## 2013-11-30 NOTE — ED Provider Notes (Signed)
CSN: [id number]     Arrival date & time 11/30/13  1318 History   First MD Initiated Contact with Patient 11/30/13 1508     Chief Complaint  Patient presents with  . Finger Injury   (Consider location/radiation/quality/duration/timing/severity/associated sxs/prior Treatment) HPI 27 yo male presents with pain and swelling of RIGHT ring finger. Patient states finger is painful to 6/10, pain is sharp and constant. Pain worse with pressure or bending. Patient denies fever, chills, N/V, CP, and Dyspnea. Denies any drainage from wound. No recent trauma.    History reviewed. No pertinent past medical history. Past Surgical History  Procedure Laterality Date  . I&d extremity  10/28/2011    Procedure: IRRIGATION AND DEBRIDEMENT EXTREMITY;  Surgeon: Tami Ribas;  Location: [facility];  Service: Orthopedics;  Laterality: Right;  IRRIGATION AND DEBRIDMENT RIGHT LONG FINGER   History reviewed. No pertinent family history. History  Substance Use Topics  . Smoking status: Never Smoker   . Smokeless tobacco: Never Used  . Alcohol Use: Yes     Comment: occassionaly    Review of Systems  All other systems reviewed and are negative.    Allergies  Review of patient's allergies indicates no known allergies.  Home Medications   Current Outpatient Rx  Name  Route  Sig  Dispense  Refill  . meloxicam (MOBIC) 15 MG tablet   Oral   Take 1 tablet (15 mg total) by mouth at bedtime. Take 1 daily with food.   10 tablet   0   . traMADol (ULTRAM) 50 MG tablet   Oral   Take 1 tablet (50 mg total) by mouth every 6 (six) hours as needed.   10 tablet   0    BP 109/76  Pulse 85  Temp(Src) 98.6 F (37 C) (Oral)  Resp 18  Wt 145 lb 5 oz (65.913 kg)  SpO2 96% Physical Exam  Nursing note and vitals reviewed. Constitutional: He is oriented to person, place, and time. He appears well-developed and well-nourished. No distress.  HENT:  Head: Normocephalic and atraumatic.  Cardiovascular: Normal rate  and regular rhythm.   Pulmonary/Chest: Effort normal and breath sounds normal.  Musculoskeletal: Normal range of motion.       Hands: Neurological: He is alert and oriented to person, place, and time.  Skin: Skin is warm and dry. He is not diaphoretic.  Psychiatric: He has a normal mood and affect. His behavior is normal.    ED Course  Procedures (including critical care time) Labs Review Labs Reviewed - No data to display Imaging Review No results found.  EKG Interpretation   None      INCISION AND DRAINAGE Performed by: Rudene Anda Consent: Verbal consent obtained. Risks and benefits: risks, benefits and alternatives were discussed Type: Paronychia  Body area: RIGHT ring finger (dorsolateral aspect)  Anesthesia: local infiltration and Digital block  Incision was made with a scalpel.  Local anesthetic: lidocaine 2% w/o epinephrine  Anesthetic total: 4 ml  Complexity: simple  Drainage: purulent  Drainage amount: 1 mL  Packing material: none  Patient tolerance: Patient tolerated the procedure well with no immediate complications.    MDM   1. Paronychia of fourth finger of right hand    I&D of paronychia performed without complication. Patient given rx for pain medication and written work note for today. Advised patient to keep finger clean and covered. Follow up with PCP, given resource guide and handout on paronychia. Return to ED if sxs worsen. Patient  agrees with plan. Discharged in good conditioin.     Allen Norris [place], PA-C 12/01/13 [phone]

## 2013-11-30 NOTE — ED Notes (Signed)
Pt c/o 4th digit on right hand having pain and swelling from possible infection around cuticle x 4 days

## 2013-11-30 NOTE — ED Notes (Signed)
Pt reports he is unsure of whether he injured his finger at work or not.

## 2013-12-02 NOTE — ED Provider Notes (Signed)
Medical screening examination/treatment/procedure(s) were performed by non-physician practitioner and as supervising physician I was immediately available for consultation/collaboration.  EKG Interpretation   None         Kristen N Ward, DO 12/02/13 1350 

## 2013-12-05 ENCOUNTER — Encounter (HOSPITAL_COMMUNITY): Payer: Self-pay | Admitting: Emergency Medicine

## 2013-12-05 ENCOUNTER — Emergency Department (HOSPITAL_COMMUNITY)
Admission: EM | Admit: 2013-12-05 | Discharge: 2013-12-05 | Disposition: A | Discharge status: Home / Self Care | Payer: BC Managed Care – PPO | Attending: Emergency Medicine | Admitting: Emergency Medicine

## 2013-12-05 ENCOUNTER — Emergency Department (HOSPITAL_COMMUNITY): Payer: BC Managed Care – PPO

## 2013-12-05 DIAGNOSIS — M2550 Pain in unspecified joint: Secondary | ICD-10-CM | POA: Insufficient documentation

## 2013-12-05 DIAGNOSIS — Z79899 Other long term (current) drug therapy: Secondary | ICD-10-CM | POA: Insufficient documentation

## 2013-12-05 DIAGNOSIS — L03019 Cellulitis of unspecified finger: Secondary | ICD-10-CM | POA: Insufficient documentation

## 2013-12-05 DIAGNOSIS — R209 Unspecified disturbances of skin sensation: Secondary | ICD-10-CM | POA: Insufficient documentation

## 2013-12-05 DIAGNOSIS — Z87828 Personal history of other (healed) physical injury and trauma: Secondary | ICD-10-CM | POA: Insufficient documentation

## 2013-12-05 DIAGNOSIS — L03011 Cellulitis of right finger: Secondary | ICD-10-CM

## 2013-12-05 MED ORDER — TRAMADOL HCL 50 MG PO TABS: 50.0000 mg | ORAL_TABLET | Freq: Four times a day (QID) | ORAL | Status: DC | PRN

## 2013-12-05 MED ORDER — ALPRAZOLAM 0.25 MG PO TABS
0.2500 mg | ORAL_TABLET | Freq: Once | ORAL | Status: AC
  Administered 2013-12-05: 0.25 mg via ORAL
  Filled 2013-12-05: qty 1

## 2013-12-05 NOTE — ED Notes (Signed)
Pt has swelling to right index finger. sts jammed in a week ago and was seen here and finger was drained. sts swelling worse and unable to open up hand.

## 2013-12-05 NOTE — ED Provider Notes (Signed)
Medical screening examination/treatment/procedure(s) were performed by non-physician practitioner and as supervising physician I was immediately available for consultation/collaboration.  EKG Interpretation   None       Devoria Albe, MD, Armando Gang   Ward Givens, MD 12/05/13 [phone]

## 2013-12-05 NOTE — ED Provider Notes (Signed)
CSN: [id number]     Arrival date & time 12/05/13  1216 History  This chart was scribed for non-physician practitioner, Milus Mallick, PA-C working with Ward Givens, MD by Greggory Stallion, ED scribe. This patient was seen in room TR06C/TR06C and the patient's care was started at 1:21 PM.     Chief Complaint  Patient presents with  . Finger Injury   The history is provided by the patient. No language interpreter was used.   HPI Comments: Fred Romero is a 27 y.o. male who presents to the Emergency Department complaining of worsening right ring finger swelling and pain. Pt was evaluated here one week ago after jamming his right ring finger. The finger was drained and he was discharged. Pt states the swelling and pain have worsened and he is now unable to open his hand. States he has some numbness and tingling in his fingers. Denies fever, chills.   No past medical history on file. Past Surgical History  Procedure Laterality Date  . I&d extremity  10/28/2011    Procedure: IRRIGATION AND DEBRIDEMENT EXTREMITY;  Surgeon: Tami Ribas;  Location: [facility];  Service: Orthopedics;  Laterality: Right;  IRRIGATION AND DEBRIDMENT RIGHT LONG FINGER   No family history on file. History  Substance Use Topics  . Smoking status: Never Smoker   . Smokeless tobacco: Never Used  . Alcohol Use: Yes     Comment: occassionaly    Review of Systems  Constitutional: Negative for fever and chills.  Musculoskeletal: Positive for arthralgias and joint swelling.  Neurological: Positive for numbness.  All other systems reviewed and are negative.    Allergies  Review of patient's allergies indicates no known allergies.  Home Medications   Current Outpatient Rx  Name  Route  Sig  Dispense  Refill  . meloxicam (MOBIC) 15 MG tablet   Oral   Take 1 tablet (15 mg total) by mouth at bedtime. Take 1 daily with food.   10 tablet   0   . traMADol (ULTRAM) 50 MG tablet   Oral   Take 1 tablet  (50 mg total) by mouth every 6 (six) hours as needed.   10 tablet   0    BP 130/89  Pulse 70  Temp(Src) 98.3 F (36.8 C) (Oral)  Resp 18  SpO2 99%  Physical Exam  Constitutional: He is oriented to person, place, and time. He appears well-developed and well-nourished. No distress.  HENT:  Head: Normocephalic and atraumatic.  Right Ear: External ear normal.  Left Ear: External ear normal.  Nose: Nose normal.  Mouth/Throat: Oropharynx is clear and moist.  Eyes: Conjunctivae are normal.  Neck: Normal range of motion. Neck supple.  Cardiovascular: Normal rate.   Pulmonary/Chest: Effort normal.  Abdominal: Soft.  Musculoskeletal: Normal range of motion.  Dorsal lateral aspect of right ring finger and nail fold is swollen and fluctuant. Tender to palpation. Pt can bend finger at all joints.   Neurological: He is alert and oriented to person, place, and time.  Sensation intact.   Skin: Skin is warm and dry. He is not diaphoretic.  Psychiatric: He has a normal mood and affect.    ED Course  Procedures (including critical care time)  INCISION AND DRAINAGE Performed by: Milus Mallick, PA-C Consent: Verbal consent obtained. Risks and benefits: risks, benefits and alternatives were discussed Type: abscess  Body area: right ring finger  Anesthesia: local infiltration  Incision was made with a scalpel.  Local anesthetic: none  Anesthetic total: 0 ml  Complexity: complex Blunt dissection to break up loculations  Drainage: purulent  Drainage amount: copious   Packing material: none  Patient tolerance: Patient tolerated the procedure well with no immediate complications.    DIAGNOSTIC STUDIES: Oxygen Saturation is 99% on RA, normal by my interpretation.    COORDINATION OF CARE: 1:23 PM-Discussed treatment plan which includes I&D with pt at bedside and pt agreed to plan.   Labs Review Labs Reviewed - No data to display Imaging Review Dg Hand Complete  Right  12/05/2013   CLINICAL DATA:  Injury to right index finger.  Pain, swelling  EXAM: RIGHT HAND - COMPLETE 3+ VIEW  COMPARISON:  10/28/2011  FINDINGS: There is no evidence of fracture or dislocation. There is no evidence of arthropathy or other focal bone abnormality. Soft tissues are unremarkable.  IMPRESSION: Negative.   Electronically Signed   By: Charlett Nose M.D.   On: 12/05/2013 13:11    EKG Interpretation   None       MDM   1. Paronychia of finger, right     Afebrile, NAD, non-toxic appearing, AAOx4. Pt with recurrent paronychia. Neurovascularly intact. Normal sensation. X-ray reviewed. Paronychia successful drained without any complication. Copious purulent drainage obtained. Wound care discussed with patient. Return precautions discussed. Advised wound check with PCP. Patient is agreeable to plan. Patient is stable at time of discharge      I personally performed the services described in this documentation, which was scribed in my presence. The recorded information has been reviewed and is accurate.  . MDM Number of Diagnoses or Management Options Paronychia of finger, right:  a=      Jeannetta Ellis, PA-C 12/05/13 1540

## 2015-03-03 ENCOUNTER — Emergency Department (HOSPITAL_COMMUNITY)
Admission: EM | Admit: 2015-03-03 | Discharge: 2015-03-03 | Disposition: A | Discharge status: Home / Self Care | Payer: Self-pay | Attending: Emergency Medicine | Admitting: Emergency Medicine

## 2015-03-03 ENCOUNTER — Encounter (HOSPITAL_COMMUNITY): Payer: Self-pay | Admitting: *Deleted

## 2015-03-03 DIAGNOSIS — G8929 Other chronic pain: Secondary | ICD-10-CM | POA: Insufficient documentation

## 2015-03-03 DIAGNOSIS — Y9389 Activity, other specified: Secondary | ICD-10-CM | POA: Insufficient documentation

## 2015-03-03 DIAGNOSIS — X58XXXA Exposure to other specified factors, initial encounter: Secondary | ICD-10-CM | POA: Insufficient documentation

## 2015-03-03 DIAGNOSIS — Y990 Civilian activity done for income or pay: Secondary | ICD-10-CM | POA: Insufficient documentation

## 2015-03-03 DIAGNOSIS — S3992XA Unspecified injury of lower back, initial encounter: Secondary | ICD-10-CM | POA: Insufficient documentation

## 2015-03-03 DIAGNOSIS — Y929 Unspecified place or not applicable: Secondary | ICD-10-CM | POA: Insufficient documentation

## 2015-03-03 DIAGNOSIS — M5441 Lumbago with sciatica, right side: Secondary | ICD-10-CM

## 2015-03-03 DIAGNOSIS — K029 Dental caries, unspecified: Secondary | ICD-10-CM | POA: Insufficient documentation

## 2015-03-03 DIAGNOSIS — K088 Other specified disorders of teeth and supporting structures: Secondary | ICD-10-CM | POA: Insufficient documentation

## 2015-03-03 MED ORDER — NAPROXEN 500 MG PO TABS: 500.0000 mg | ORAL_TABLET | Freq: Two times a day (BID) | ORAL | Status: DC

## 2015-03-03 MED ORDER — TRAMADOL HCL 50 MG PO TABS: 50.0000 mg | ORAL_TABLET | Freq: Four times a day (QID) | ORAL | Status: DC | PRN

## 2015-03-03 MED ORDER — CYCLOBENZAPRINE HCL 10 MG PO TABS: 10.0000 mg | ORAL_TABLET | Freq: Two times a day (BID) | ORAL | Status: DC | PRN

## 2015-03-03 NOTE — ED Provider Notes (Signed)
CSN: [id number]     Arrival date & time 03/03/15  1922 History  This chart was scribed for a non-physician practitioner, Junius Finner, PA-C working with Rolan Bucco, MD by [place], ED Scribe. The patient was seen in TR10C/TR10C. The patient's care was started at 8:42 PM.    Chief Complaint  Patient presents with  . Back Pain  . Dental Pain      Patient is a 29 y.o. male presenting with back pain and tooth pain. The history is provided by the patient. No language interpreter was used.  Back Pain Associated symptoms: numbness   Dental Pain   HPI Comments: Fred Romero is a 29 y.o. male who presents to the Emergency Department complaining of flare up of chronic lower back pain. Pt reports that he injured his back while at work trying to pick up something heavy. Pt explains that he works at [employer] and has to lift heavy objects frequently. Back pain is aching and sore, 8/10, worse with certain movements.  Reports taking off brand tylenol w/o relief.  He also complains or numbness in his right leg that is brought on by ambulation. No complaints of bladder or bowel incontinence. Pt is non-smoker.   Pt also complains of lower left-sided dental pain. Pt reports he thinks it is his wisdom tooth that is providing him discomfort.   History reviewed. No pertinent past medical history. Past Surgical History  Procedure Laterality Date  . I&d extremity  10/28/2011    Procedure: IRRIGATION AND DEBRIDEMENT EXTREMITY;  Surgeon: Tami Ribas;  Location: [facility];  Service: Orthopedics;  Laterality: Right;  IRRIGATION AND DEBRIDMENT RIGHT LONG FINGER   History reviewed. No pertinent family history. History  Substance Use Topics  . Smoking status: Never Smoker   . Smokeless tobacco: Never Used  . Alcohol Use: Yes     Comment: occassionaly    Review of Systems  HENT: Positive for dental problem.   Gastrointestinal: Negative for diarrhea and constipation.  Genitourinary: Negative for  urgency, frequency, decreased urine volume and difficulty urinating.  Musculoskeletal: Positive for back pain.  Neurological: Positive for numbness.      Allergies  Review of patient's allergies indicates no known allergies.  Home Medications   Prior to Admission medications   Medication Sig Start Date End Date Taking? Authorizing Provider  cyclobenzaprine (FLEXERIL) 10 MG tablet Take 1 tablet (10 mg total) by mouth 2 (two) times daily as needed for muscle spasms. 03/03/15   Junius Finner, PA-C  naproxen (NAPROSYN) 500 MG tablet Take 1 tablet (500 mg total) by mouth 2 (two) times daily. 03/03/15   Junius Finner, PA-C  traMADol (ULTRAM) 50 MG tablet Take 1 tablet (50 mg total) by mouth every 6 (six) hours as needed for severe pain. 12/05/13   Jennifer Piepenbrink, PA-C  traMADol (ULTRAM) 50 MG tablet Take 1 tablet (50 mg total) by mouth every 6 (six) hours as needed. 03/03/15   Junius Finner, PA-C   BP 122/64 mmHg  Pulse 86  Temp(Src) 98 F (36.7 C) (Oral)  Resp 20  Wt 153 lb (69.4 kg)  SpO2 100% Physical Exam  Constitutional: He is oriented to person, place, and time. He appears well-developed and well-nourished.  HENT:  Head: Normocephalic and atraumatic.  Dental- left lower wisdom tooth partially erupted, rubbing on the buccal mucosa. No gingival swelling. No discharge or bleeding.   Eyes: EOM are normal.  Neck: Normal range of motion.  Cardiovascular: Normal rate.   Pulmonary/Chest:  Effort normal.  Musculoskeletal: Normal range of motion.  Back- no midline spinal tenderness. Tenderness to right lumbar muscles. Full ROM of arms and legs with 5/5 strength.   Neurological: He is alert and oriented to person, place, and time.  Sensation intact in UE's and LE's.   Skin: Skin is warm and dry.  Psychiatric: He has a normal mood and affect. His behavior is normal.  Nursing note and vitals reviewed.   ED Course  Procedures (including critical care time) Labs Review Labs Reviewed  - No data to display  Imaging Review No results found.   EKG Interpretation None     Medications - No data to display  8:45 PM- Treatment plan was discussed with patient who verbalizes understanding and agrees.   MDM   Final diagnoses:  Right-sided low back pain with right-sided sciatica  Pain due to dental caries    Pt is a 29yo male presenting to ED with c/o lower back pain after lifting heavy boxes at work. No red flag symptoms. Pt is tender along muscles.  Do not believe imaging needed at this time. Not concerned for emergent process taking place. Will tx symptomatically as needed for pain.  Pt also c/o pain with his left lower wisdom tooth. Tooth appears to be rubbing against buccal mucosa. No gingival abscess appreciated.  Pt reports having f/u with dentist on Wednesday, 2 days from now. Will refrain from starting pt on antibiotics as no obvious abscess present and pt has close f/u.  Rx: tramadol, flexeril and naproxen. Home care instructions provided. Return precautions provided. Pt verbalized understanding and agreement with tx plan.   I personally performed the services described in this documentation, which was scribed in my presence. The recorded information has been reviewed and is accurate.    Junius Finnerrin O'Malley, PA-C 03/03/15 2100  Rolan BuccoMelanie Belfi, MD 03/04/15 [phone]

## 2015-03-03 NOTE — ED Notes (Signed)
Pt in c/o flair up of his chronic back pain, states he pulled something heavy at work and pain returned, also c/o toothache

## 2015-03-03 NOTE — ED Notes (Signed)
Pt made aware to return if symptoms worsen or if any life threatening symptoms occur.   

## 2016-09-24 ENCOUNTER — Encounter (HOSPITAL_COMMUNITY): Payer: Self-pay

## 2016-09-24 ENCOUNTER — Emergency Department (HOSPITAL_COMMUNITY)
Admission: EM | Admit: 2016-09-24 | Discharge: 2016-09-24 | Disposition: A | Discharge status: Home / Self Care | Payer: BLUE CROSS/BLUE SHIELD | Attending: Emergency Medicine | Admitting: Emergency Medicine

## 2016-09-24 DIAGNOSIS — Y990 Civilian activity done for income or pay: Secondary | ICD-10-CM | POA: Insufficient documentation

## 2016-09-24 DIAGNOSIS — M7989 Other specified soft tissue disorders: Secondary | ICD-10-CM | POA: Diagnosis present

## 2016-09-24 DIAGNOSIS — L03012 Cellulitis of left finger: Secondary | ICD-10-CM | POA: Diagnosis not present

## 2016-09-24 DIAGNOSIS — L03011 Cellulitis of right finger: Secondary | ICD-10-CM | POA: Diagnosis not present

## 2016-09-24 DIAGNOSIS — Y929 Unspecified place or not applicable: Secondary | ICD-10-CM | POA: Diagnosis not present

## 2016-09-24 DIAGNOSIS — X500XXA Overexertion from strenuous movement or load, initial encounter: Secondary | ICD-10-CM | POA: Insufficient documentation

## 2016-09-24 DIAGNOSIS — Y939 Activity, unspecified: Secondary | ICD-10-CM | POA: Diagnosis not present

## 2016-09-24 MED ORDER — IBUPROFEN 400 MG PO TABS
800.0000 mg | ORAL_TABLET | Freq: Once | ORAL | Status: AC
  Administered 2016-09-24: 800 mg via ORAL
  Filled 2016-09-24: qty 2

## 2016-09-24 MED ORDER — CEPHALEXIN 500 MG PO CAPS: 500.0000 mg | ORAL_CAPSULE | Freq: Four times a day (QID) | ORAL | 0 refills | Status: DC

## 2016-09-24 MED ORDER — LIDOCAINE HCL (PF) 1 % IJ SOLN
10.0000 mL | Freq: Once | INTRAMUSCULAR | Status: AC
  Administered 2016-09-24: 10 mL
  Filled 2016-09-24: qty 10

## 2016-09-24 NOTE — ED Provider Notes (Signed)
[facility]-EMERGENCY DEPT Provider Note   CSN: [id number] Arrival date & time: 09/24/16  1543  By signing my name below, I, Placido Sou, attest that this documentation has been prepared under the direction and in the presence of [employer], PA-C. Electronically Signed: Placido Sou, ED Scribe. 09/24/16. 4:44 PM.   History   Chief Complaint Chief Complaint  Patient presents with  . Finger Injury    HPI HPI Comments: Fred Romero is a 30 y.o. male who presents to the Emergency Department complaining of worsening, mild, right thumb swelling x 2 days. Pt states that he noticed his swelling while at home further noting that he works at [employer] and lifts heavy boxes regularly and regularly hits the affected region while working. He reports an associated, mild, HA last night as well as moderate pain across the affected thumb and right palm. Pt additionally complains of similar pain and swelling to his left middle finger. His pain worsens with palpation and hand movement. He has soaked the fingers and applied ice w/o significant relief. He has no other known medical conditions including DM. Pt is unsure of the timing of his most recent tetanus vaccination. He denies any drainage from the site, fevers, or chills.   The history is provided by the patient and medical records. No language interpreter was used.    History reviewed. No pertinent past medical history.  There are no active problems to display for this patient.   Past Surgical History:  Procedure Laterality Date  . I&D EXTREMITY  10/28/2011   Procedure: IRRIGATION AND DEBRIDEMENT EXTREMITY;  Surgeon: Tami Ribas;  Location: [facility];  Service: Orthopedics;  Laterality: Right;  IRRIGATION AND DEBRIDMENT RIGHT LONG FINGER       Home Medications    Prior to Admission medications   Medication Sig Start Date End Date Taking? Authorizing Provider  cephALEXin (KEFLEX) 500 MG capsule Take 1 capsule (500 mg total) by  mouth 4 (four) times daily. 09/24/16   Deveion Denz, PA-C  cyclobenzaprine (FLEXERIL) 10 MG tablet Take 1 tablet (10 mg total) by mouth 2 (two) times daily as needed for muscle spasms. 03/03/15   Junius Finner, PA-C  naproxen (NAPROSYN) 500 MG tablet Take 1 tablet (500 mg total) by mouth 2 (two) times daily. 03/03/15   Junius Finner, PA-C  traMADol (ULTRAM) 50 MG tablet Take 1 tablet (50 mg total) by mouth every 6 (six) hours as needed for severe pain. 12/05/13   Jennifer Piepenbrink, PA-C  traMADol (ULTRAM) 50 MG tablet Take 1 tablet (50 mg total) by mouth every 6 (six) hours as needed. 03/03/15   Junius Finner, PA-C    Family History History reviewed. No pertinent family history.  Social History Social History  Substance Use Topics  . Smoking status: Never Smoker  . Smokeless tobacco: Never Used  . Alcohol use Yes     Comment: occassionaly     Allergies   Review of patient's allergies indicates no known allergies.   Review of Systems Review of Systems  Constitutional: Negative for chills and fever.  Musculoskeletal: Positive for arthralgias and joint swelling.  Skin: Positive for color change. Negative for wound.  Allergic/Immunologic: Negative for immunocompromised state.  Neurological: Positive for headaches. Negative for numbness.   Physical Exam Updated Vital Signs BP 143/88 (BP Location: Left Arm)   Pulse 72   Temp 98.1 F (36.7 C) (Oral)   Resp 18   SpO2 100%   Physical Exam  Constitutional: He appears well-developed and  well-nourished. No distress.  HENT:  Head: Normocephalic and atraumatic.  Eyes: Conjunctivae are normal.  Neck: Normal range of motion.  Cardiovascular: Normal rate, regular rhythm and intact distal pulses.   Capillary refill < 3 sec  Pulmonary/Chest: Effort normal and breath sounds normal.  Musculoskeletal: He exhibits edema and tenderness.  Right thumb with swelling and induration along the cuticle and nailbed with obvious abscess and  paronychia. Pad of the finger is tender but soft. No induration or increased warmth to the pad. Mild erythema noted just past the DIP w/o overlying cellulitis. Slightly decreased ROM.  Left middle finger with swelling and erythema around cuticle and nailbed. TTP. No red streaking noted.   Neurological: He is alert. Coordination normal.  Sensation intact in the BUE Strength 5/5 in the BUE  Skin: Skin is warm and dry. He is not diaphoretic. There is erythema.  No tenting of the skin  Psychiatric: He has a normal mood and affect.  Nursing note and vitals reviewed.   ED Treatments / Results   Procedures .Marland Kitchen.Incision and Drainage Date/Time: 09/24/2016 5:45 PM Performed by: Dierdre ForthMUTHERSBAUGH, Antoine Vandermeulen Authorized by: Dierdre ForthMUTHERSBAUGH, Hosea Hanawalt   Consent:    Consent obtained:  Verbal   Consent given by:  Patient   Risks discussed:  Pain Location:    Type:  Abscess   Location:  Upper extremity   Upper extremity location:  Hand   Hand location:  R hand (right thumb) Anesthesia (see MAR for exact dosages):    Anesthesia method:  Local infiltration   Local anesthetic:  Lidocaine 1% w/o epi Procedure type:    Complexity:  Simple Procedure details:    Incision types:  Stab incision and single straight   Incision depth:  Dermal   Scalpel blade:  11   Wound management:  Probed and deloculated   Drainage:  Purulent   Drainage amount:  Copious   Wound treatment:  Wound left open   Packing materials:  None Post-procedure details:    Patient tolerance of procedure:  Tolerated well, no immediate complications  .Marland Kitchen.Incision and Drainage Date/Time: 09/24/2016 6:07 PM Performed by: Dierdre ForthMUTHERSBAUGH, Kleo Dungee Authorized by: Dierdre ForthMUTHERSBAUGH, Laverna Dossett   Consent:    Consent obtained:  Verbal   Consent given by:  Patient   Risks discussed:  Pain Location:    Type:  Abscess   Location:  Upper extremity   Upper extremity location:  Hand   Hand location:  L hand (left middle finger) Anesthesia (see MAR for exact  dosages):    Anesthesia method:  Local infiltration   Local anesthetic:  Lidocaine 1% w/o epi Procedure type:    Complexity:  Simple Procedure details:    Incision types:  Stab incision   Incision depth:  Dermal   Scalpel blade:  11   Wound management:  Probed and deloculated   Drainage:  Purulent   Drainage amount:  Moderate   Wound treatment:  Wound left open   Packing materials:  None Post-procedure details:    Patient tolerance of procedure:  Tolerated well, no immediate complications    DIAGNOSTIC STUDIES: Oxygen Saturation is 99% on RA, normal by my interpretation.    COORDINATION OF CARE: 4:43 PM Discussed next steps with pt. Pt verbalized understanding and is agreeable with the plan.    Medications Ordered in ED Medications  lidocaine (PF) (XYLOCAINE) 1 % injection 10 mL (10 mLs Infiltration Given 09/24/16 1701)  ibuprofen (ADVIL,MOTRIN) tablet 800 mg (800 mg Oral Given 09/24/16 1827)     Initial Impression /  Assessment and Plan / ED Course  I have reviewed the triage vital signs and the nursing notes.  Pertinent labs & imaging results that were available during my care of the patient were reviewed by me and considered in my medical decision making (see chart for details).  Clinical Course   He presents with bilateral paronychia of the right thumb and left middle finger. I and D's of both. Patient discharged home with Keflex. Conservative treatment including warm water soaks given. Patient is to follow-up with his primary care provider within 48 hours for wound check. He is to return to the emergency department for fevers, chills, spreading erythema or other concerns.  I personally performed the services described in this documentation, which was scribed in my presence. The recorded information has been reviewed and is accurate.  Final Clinical Impressions(s) / ED Diagnoses   Final diagnoses:  Paronychia of right thumb  Paronychia of left middle finger    New  Prescriptions Discharge Medication List as of 09/24/2016  6:05 PM    START taking these medications   Details  cephALEXin (KEFLEX) 500 MG capsule Take 1 capsule (500 mg total) by mouth 4 (four) times daily., Starting Fri 09/24/2016, Print         Mahsa Hanser, PA-C 09/24/16 2224    Maia Plan, MD 09/25/16 1143

## 2016-09-24 NOTE — Discharge Instructions (Signed)
1. Medications: keflex, usual home medications 2. Treatment: rest, drink plenty of fluids, warm soaks 2-3x per day, tylenol or ibuprofen for pain control 3. Follow Up: Please followup with your primary doctor in 2-3 days for discussion of your diagnoses and further evaluation after today's visit; if you do not have a primary care doctor use the resource guide provided to find one; Please return to the ER for if symptoms are not improving or begin to worsen.

## 2016-09-24 NOTE — ED Notes (Signed)
See providers assessment.  

## 2016-09-24 NOTE — ED Triage Notes (Signed)
Pt has swelling to the right thumb. Thumb is swollen, pt denies known injury but does work at [employer] and does lots of heavy lifting.

## 2016-09-24 NOTE — ED Notes (Signed)
Pt stable, understands discharge instructions, and reasons for return.   

## 2017-07-16 ENCOUNTER — Emergency Department (HOSPITAL_COMMUNITY)
Admission: EM | Admit: 2017-07-16 | Discharge: 2017-07-16 | Disposition: A | Discharge status: Home / Self Care | Payer: BLUE CROSS/BLUE SHIELD | Attending: Emergency Medicine | Admitting: Emergency Medicine

## 2017-07-16 ENCOUNTER — Encounter (HOSPITAL_COMMUNITY): Payer: Self-pay | Admitting: *Deleted

## 2017-07-16 DIAGNOSIS — G8929 Other chronic pain: Secondary | ICD-10-CM | POA: Insufficient documentation

## 2017-07-16 DIAGNOSIS — M545 Low back pain, unspecified: Secondary | ICD-10-CM

## 2017-07-16 DIAGNOSIS — Z79899 Other long term (current) drug therapy: Secondary | ICD-10-CM | POA: Insufficient documentation

## 2017-07-16 MED ORDER — MELOXICAM 15 MG PO TABS: 15.0000 mg | ORAL_TABLET | Freq: Every day | ORAL | 1 refills | Status: DC

## 2017-07-16 MED ORDER — KETOROLAC TROMETHAMINE 30 MG/ML IJ SOLN
15.0000 mg | Freq: Once | INTRAMUSCULAR | Status: AC
  Administered 2017-07-16: 15 mg via INTRAMUSCULAR
  Filled 2017-07-16: qty 1

## 2017-07-16 NOTE — Discharge Instructions (Signed)
You were seen here today for back pain. You were offered imaging but did not wish to have them at this time. I am giving you a referral to an orthopedist for further treatment and evaluation of your back pain. I am giving you a course of anti-inflammatory medication to take at home once daily. Please take this medication with food as it may upset your stomach.

## 2017-07-16 NOTE — ED Triage Notes (Signed)
Pt reports ongoing pain to his entire back and does heavy lifting at work. Unable to work due to pain and wants referral to ortho. Ambulatory at triage.

## 2017-07-16 NOTE — ED Notes (Signed)
Hx of chronic LBP, works for [employer]. States pain has been worsening the last week. Needs referral to specialist.

## 2017-07-16 NOTE — ED Provider Notes (Signed)
[facility] Provider Note   CSN: [id number] Arrival date & time: 07/16/17  1024     History   Chief Complaint Chief Complaint  Patient presents with  . Back Pain    HPI Fred Romero is a 31 y.o. male with history of back pain who presents to the emergency department for acute on chronic back pain 3 weeks. 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 He states that 3 weeks ago he had a day where he worked a very long shift and then started feeling a dull, achy pain in his right lower back but does not radiate into his legs, abdomen, groin. The patient states that the pain is brought on when he bends over to pick something up or when he is walking. He has not taken anything for this. He rates his pain as 8/10. Denies history of cancer, trauma, fever, night pain, IV drug use, recent spinal manipulation or procedures, upper back pain or neck pain, numbness/tingling/weakness of the lower extremities, urinary retention, loss of bowel/bladder function, saddle anesthesia, or unexplained weight loss.   HPI  History reviewed. No pertinent past medical history.  There are no active problems to display for this patient.   Past Surgical History:  Procedure Laterality Date  . I&D EXTREMITY  10/28/2011   Procedure: IRRIGATION AND DEBRIDEMENT EXTREMITY;  Surgeon: Tami RibasKevin R Kuzma;  Location: [facility];  Service: Orthopedics;  Laterality: Right;  IRRIGATION AND DEBRIDMENT RIGHT LONG FINGER       Home Medications    Prior to Admission medications   Medication Sig Start Date End Date Taking? Authorizing Provider  cephALEXin (KEFLEX) 500 MG capsule Take 1 capsule (500 mg total) by mouth 4 (four) times daily. 09/24/16   Muthersbaugh, Dahlia ClientHannah, PA-C  cyclobenzaprine (FLEXERIL) 10 MG tablet Take 1 tablet (10 mg total) by mouth 2 (two) times daily as needed for muscle spasms. 03/03/15   Lurene ShadowPhelps, Erin O, PA-C    meloxicam (MOBIC) 15 MG tablet Take 1 tablet (15 mg total) by mouth daily. 07/16/17   Maczis, Elmer SowMichael M, PA-C  naproxen (NAPROSYN) 500 MG tablet Take 1 tablet (500 mg total) by mouth 2 (two) times daily. 03/03/15   Lurene ShadowPhelps, Erin O, PA-C  traMADol (ULTRAM) 50 MG tablet Take 1 tablet (50 mg total) by mouth every 6 (six) hours as needed for severe pain. 12/05/13   Piepenbrink, Victorino DikeJennifer, PA-C  traMADol (ULTRAM) 50 MG tablet Take 1 tablet (50 mg total) by mouth every 6 (six) hours as needed. 03/03/15   Lurene ShadowPhelps, Erin O, PA-C    Family History History reviewed. No pertinent family history.  Social History Social History  Substance Use Topics  . Smoking status: Never Smoker  . Smokeless tobacco: Never Used  . Alcohol use Yes     Comment: occassionaly     Allergies   Patient has no known allergies.   Review of Systems Review of Systems  Constitutional: Negative for fever.  Genitourinary: Negative.   Musculoskeletal: Positive for back pain. Negative for neck pain.  Skin: Negative for wound.  Neurological: Negative for weakness and numbness.  All other systems reviewed and are negative.    Physical Exam Updated Vital Signs BP 123/82 (BP Location: Right Arm)   Pulse 68   Temp 98.2 F (36.8 C) (Oral)   Resp 18   SpO2 100%   Physical Exam  Constitutional: He is oriented to person, place, and  time. He appears well-developed and well-nourished. No distress.  HENT:  Head: Normocephalic and atraumatic.  Right Ear: External ear normal.  Left Ear: External ear normal.  Eyes: Conjunctivae are normal. Right eye exhibits no discharge. Left eye exhibits no discharge. No scleral icterus.  Neck: Normal range of motion. Neck supple.  Cardiovascular:  Pulses:      Dorsalis pedis pulses are 2+ on the right side, and 2+ on the left side.       Posterior tibial pulses are 2+ on the right side, and 2+ on the left side.  Pulmonary/Chest: Effort normal. No respiratory distress.  Abdominal: He  exhibits no pulsatile midline mass. There is no tenderness.  Musculoskeletal: Normal range of motion. He exhibits tenderness. He exhibits no edema.       Cervical back: Normal.       Thoracic back: Normal.       Lumbar back: He exhibits normal range of motion.  No C, T, or L spine tenderness to palpation. Mild right lumbar paraspinal tenderness. Noted spasms of the left lumbar area. No obvious signs of trauma, deformity, infection, step-offs. Normal lumbar ROM. Lung expansion normal. No scoliosis or kyphosis. Bilateral lower extremity strength 5 out of 5, sensation grossly intact. Straight leg negative b/l. Ambulates appropriately with mild pain.    Neurological: He is alert and oriented to person, place, and time. He has normal strength. No sensory deficit.  Skin: Skin is warm, dry and intact. No rash noted. He is not diaphoretic. No erythema. No pallor.  Psychiatric: He has a normal mood and affect. His behavior is normal. Judgment and thought content normal.  Nursing note and vitals reviewed.    ED Treatments / Results  Labs (all labs ordered are listed, but only abnormal results are displayed) Labs Reviewed - No data to display  EKG  EKG Interpretation None       Radiology No results found.  Procedures Procedures (including critical care time)  Medications Ordered in ED Medications  ketorolac (TORADOL) 30 MG/ML injection 15 mg (15 mg Intramuscular Given 07/16/17 1115)     Initial Impression / Assessment and Plan / ED Course  I have reviewed the triage vital signs and the nursing notes.  Pertinent labs & imaging results that were available during my care of the patient were reviewed by me and considered in my medical decision making (see chart for details).     31 year old male with right low back pain.  No neurological deficits and normal neuro exam.  Patient can walk but states is painful.  No loss of bowel or bladder control.  No concern for cauda equina.  No fever,  night sweats, weight loss, h/o cancer, or IVDU. Pain managed in the ED.   Patient states he wants a referral to ortho for this. I provided this for him.  RICE protocol and pain medicine indicated and discussed with patient.  I advised the patient to return to the emergency department with new or worsening symptoms or new concerns. Specific return precautions discussed including signs of cauda equina. The patient verbalized understanding and agreement with plan. All questions answered. No further questions at this time. The patient is hemodynamically stable, mentating appropriately and appears safe for discharge.   Final Clinical Impressions(s) / ED Diagnoses   Final diagnoses:  Chronic right-sided low back pain without sciatica    New Prescriptions Discharge Medication List as of 07/16/2017 11:10 AM    START taking these medications   Details  meloxicam (MOBIC) 15 MG tablet Take 1 tablet (15 mg total) by mouth daily., Starting Sat 07/16/2017, Print         Maczis, Elmer SowMichael M, PA-C 07/16/17 1255    Jacinto HalimMaczis, Michael M, PA-C 07/16/17 1311    Gerhard MunchLockwood, Robert, MD 07/16/17 [phone]

## 2020-06-20 ENCOUNTER — Ambulatory Visit (HOSPITAL_COMMUNITY)
Admission: EM | Admit: 2020-06-20 | Discharge: 2020-06-20 | Disposition: A | Discharge status: Home / Self Care | Payer: BLUE CROSS/BLUE SHIELD | Attending: Family Medicine | Admitting: Family Medicine

## 2020-06-20 ENCOUNTER — Other Ambulatory Visit: Payer: Self-pay

## 2020-06-20 ENCOUNTER — Encounter (HOSPITAL_COMMUNITY): Payer: Self-pay

## 2020-06-20 DIAGNOSIS — M546 Pain in thoracic spine: Secondary | ICD-10-CM

## 2020-06-20 DIAGNOSIS — S46911A Strain of unspecified muscle, fascia and tendon at shoulder and upper arm level, right arm, initial encounter: Secondary | ICD-10-CM | POA: Diagnosis not present

## 2020-06-20 MED ORDER — CYCLOBENZAPRINE HCL 5 MG PO TABS: 5.0000 mg | ORAL_TABLET | Freq: Two times a day (BID) | ORAL | 0 refills | Status: AC | PRN

## 2020-06-20 MED ORDER — IBUPROFEN 800 MG PO TABS: 800.0000 mg | ORAL_TABLET | Freq: Three times a day (TID) | ORAL | 0 refills | Status: AC
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
## 2020-06-20 NOTE — Discharge Instructions (Addendum)
Use anti-inflammatories for pain/swelling. You may take up to 800 mg Ibuprofen every 8 hours with food. You may supplement Ibuprofen with Tylenol [id number] mg every 8 hours.   You may use flexeril as needed to help with pain. This is a muscle relaxer and causes sedation- please use only at bedtime or when you will be home and not have to drive/work  Gentle stretching and moving of shoulder  Follow up if not improving or worsening
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
## 2021-10-11 ENCOUNTER — Encounter: Payer: Self-pay | Admitting: Emergency Medicine

## 2021-10-11 ENCOUNTER — Ambulatory Visit
Admission: EM | Admit: 2021-10-11 | Discharge: 2021-10-11 | Disposition: A | Discharge status: Home / Self Care | Payer: BLUE CROSS/BLUE SHIELD | Attending: Urgent Care | Admitting: Urgent Care

## 2021-10-11 ENCOUNTER — Other Ambulatory Visit: Payer: Self-pay

## 2021-10-11 DIAGNOSIS — M25512 Pain in left shoulder: Secondary | ICD-10-CM

## 2021-10-11 DIAGNOSIS — M79602 Pain in left arm: Secondary | ICD-10-CM

## 2021-10-11 MED ORDER — TIZANIDINE HCL 4 MG PO TABS: 4.0000 mg | ORAL_TABLET | Freq: Every day | ORAL | 0 refills | Status: AC

## 2021-10-11 MED ORDER — NAPROXEN 500 MG PO TABS: 500.0000 mg | ORAL_TABLET | Freq: Two times a day (BID) | ORAL | 0 refills | Status: AC
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
